# Patient Record
Sex: Female | Born: 1995 | Race: White | Hispanic: No | Marital: Married | Smoking: Current every day smoker
Health system: Southern US, Community
[De-identification: ages and names within clinical notes are randomized; demographics above are authoritative.]

## PROBLEM LIST (undated history)

## (undated) DIAGNOSIS — Z789 Other specified health status: Secondary | ICD-10-CM

## (undated) DIAGNOSIS — Z3490 Encounter for supervision of normal pregnancy, unspecified, unspecified trimester: Secondary | ICD-10-CM

## (undated) HISTORY — DX: Other specified health status: Z78.9

---
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
## 2013-01-02 ENCOUNTER — Ambulatory Visit: Payer: BC Managed Care – PPO | Admitting: Women's Health

## 2013-11-12 ENCOUNTER — Emergency Department (HOSPITAL_COMMUNITY)
Admission: EM | Admit: 2013-11-12 | Discharge: 2013-11-12 | Disposition: A | Discharge status: Home / Self Care | Payer: BC Managed Care – PPO | Attending: Emergency Medicine | Admitting: Emergency Medicine

## 2013-11-12 ENCOUNTER — Encounter (HOSPITAL_COMMUNITY): Payer: Self-pay | Admitting: Emergency Medicine

## 2013-11-12 ENCOUNTER — Emergency Department (HOSPITAL_COMMUNITY): Payer: BC Managed Care – PPO

## 2013-11-12 DIAGNOSIS — Y929 Unspecified place or not applicable: Secondary | ICD-10-CM | POA: Diagnosis not present

## 2013-11-12 DIAGNOSIS — S92309A Fracture of unspecified metatarsal bone(s), unspecified foot, initial encounter for closed fracture: Secondary | ICD-10-CM | POA: Diagnosis not present

## 2013-11-12 DIAGNOSIS — Z79899 Other long term (current) drug therapy: Secondary | ICD-10-CM | POA: Diagnosis not present

## 2013-11-12 DIAGNOSIS — Z792 Long term (current) use of antibiotics: Secondary | ICD-10-CM | POA: Diagnosis not present

## 2013-11-12 DIAGNOSIS — Z87891 Personal history of nicotine dependence: Secondary | ICD-10-CM | POA: Insufficient documentation

## 2013-11-12 DIAGNOSIS — Y9389 Activity, other specified: Secondary | ICD-10-CM | POA: Insufficient documentation

## 2013-11-12 DIAGNOSIS — S92302A Fracture of unspecified metatarsal bone(s), left foot, initial encounter for closed fracture: Secondary | ICD-10-CM

## 2013-11-12 DIAGNOSIS — S99929A Unspecified injury of unspecified foot, initial encounter: Secondary | ICD-10-CM | POA: Diagnosis present

## 2013-11-12 DIAGNOSIS — X500XXA Overexertion from strenuous movement or load, initial encounter: Secondary | ICD-10-CM | POA: Diagnosis not present

## 2013-11-12 DIAGNOSIS — S8990XA Unspecified injury of unspecified lower leg, initial encounter: Secondary | ICD-10-CM | POA: Diagnosis present

## 2013-11-12 MED ORDER — HYDROCODONE-ACETAMINOPHEN 5-325 MG PO TABS
1.0000 | ORAL_TABLET | Freq: Once | ORAL | Status: AC
  Administered 2013-11-12: 1 via ORAL
  Filled 2013-11-12: qty 1

## 2013-11-12 MED ORDER — HYDROCODONE-ACETAMINOPHEN 5-325 MG PO TABS: ORAL_TABLET | ORAL | Status: DC

## 2013-11-12 NOTE — ED Notes (Signed)
Pt verbalized understanding of no driving and to use caution within 4 hours of taking pain med due to med causes drowsiness  

## 2013-11-12 NOTE — ED Notes (Signed)
Pt had crutches with her

## 2013-11-12 NOTE — Discharge Instructions (Signed)
Metatarsal Fracture, Undisplaced A metatarsal fracture is a break in the bone(s) of the foot. These are the bones of the foot that connect your toes to the bones of the ankle. DIAGNOSIS  The diagnoses of these fractures are usually made with X-rays. If there are problems in the forefoot and x-rays are normal a later bone scan will usually make the diagnosis.  TREATMENT AND HOME CARE INSTRUCTIONS  Treatment may or may not include a cast or walking shoe. When casts are needed the use is usually for short periods of time so as not to slow down healing with muscle wasting (atrophy).  Activities should be stopped until further advised by your caregiver.  Wear shoes with adequate shock absorbing capabilities and stiff soles.  Alternative exercise may be undertaken while waiting for healing. These may include bicycling and swimming, or as your caregiver suggests.  It is important to keep all follow-up visits or specialty referrals. The failure to keep these appointments could result in improper bone healing and chronic pain or disability.  Warning: Do not drive a car or operate a motor vehicle until your caregiver specifically tells you it is safe to do so. IF YOU DO NOT HAVE A CAST OR SPLINT:  You may walk on your injured foot as tolerated or advised.  Do not put any weight on your injured foot for as long as directed by your caregiver. Slowly increase the amount of time you walk on the foot as the pain allows or as advised.  Use crutches until you can bear weight without pain. A gradual increase in weight bearing may help.  Apply ice to the injury for 15-20 minutes each hour while awake for the first 2 days. Put the ice in a plastic bag and place a towel between the bag of ice and your skin.  Only take over-the-counter or prescription medicines for pain, discomfort, or fever as directed by your caregiver. SEEK IMMEDIATE MEDICAL CARE IF:   Your cast gets damaged or breaks.  You have  continued severe pain or more swelling than you did before the cast was put on, or the pain is not controlled with medications.  Your skin or nails below the injury turn blue or grey, or feel cold or numb.  There is a bad smell, or new stains or pus-like (purulent) drainage coming from the cast. MAKE SURE YOU:   Understand these instructions.  Will watch your condition.  Will get help right away if you are not doing well or get worse. Document Released: 02/14/2002 Document Revised: 08/17/2011 Document Reviewed: 01/06/2008 Exit[employer] Patient Information 2014 [employer].  

## 2013-11-12 NOTE — ED Provider Notes (Signed)
CSN: [id number]     Arrival date & time 11/12/13  1731 History  This chart was scribed for non-physician practitioner Pauline Ausammy Cassian Torelli, PA-C working with Vanetta MuldersScott Zackowski, MD by Danella Maiersaroline Early, ED Scribe. This patient was seen in room APFT23/APFT23 and the patient's care was started at 6:19 PM.    Chief Complaint  Patient presents with  . Foot Pain   Patient is a 18 y.o. female presenting with foot injury. The history is provided by the patient. No language interpreter was used.  Foot Injury Location:  Foot Foot location:  Dorsum of L foot Pain details:    Radiates to:  Does not radiate   Severity:  Moderate   Onset quality:  Sudden Associated symptoms: no back pain, no fatigue, no fever and no neck pain    HPI Comments: Theresa Brady is a 18 y.o. female who presents to the Emergency Department complaining of left foot pain with associated swelling since catching herself from falling while walking down the stairs in her home earlier today. She states she inverted the foot when she landed on it. She denies hitting her head or LOC. She denies neck pain, back pain, ankle pain, hip pain, or pain anywhere else. She iced the earlier PTA.    Past Medical History  Diagnosis Date  . Pregnant    Past Surgical History  Procedure Laterality Date  . Cholecystectomy N/A 2011   Family History  Problem Relation Age of Onset  . Hypertension Father    History  Substance Use Topics  . Smoking status: Former Smoker    Types: Cigarettes  . Smokeless tobacco: Not on file  . Alcohol Use: No   OB History   Grav Para Term Preterm Abortions TAB SAB Ect Mult Living   1 1 1       1      Review of Systems  Constitutional: Negative for fever, chills and fatigue.  HENT: Negative for sore throat and trouble swallowing.   Respiratory: Negative for cough, shortness of breath and wheezing.   Cardiovascular: Negative for chest pain and palpitations.  Gastrointestinal: Negative for nausea, vomiting,  abdominal pain and blood in stool.  Genitourinary: Negative for dysuria, hematuria and flank pain.  Musculoskeletal: Positive for arthralgias. Negative for back pain, myalgias, neck pain and neck stiffness.  Skin: Negative for rash.  Neurological: Negative for dizziness, weakness and numbness.  Hematological: Does not bruise/bleed easily.      Allergies  Review of patient's allergies indicates no known allergies.  Home Medications   Prior to Admission medications   Medication Sig Start Date End Date Taking? Authorizing Provider  amoxicillin (AMOXIL) 250 MG capsule Take 250 mg by mouth 2 (two) times daily.    Historical Provider, MD  ibuprofen (ADVIL,MOTRIN) 600 MG tablet Take 1 tablet (600 mg total) by mouth every 6 (six) hours. 10/15/12   Arabella MerlesKimberly D Shaw, CNM  levonorgestrel (MIRENA) 20 MCG/24HR IUD 1 each by Intrauterine route once.    Historical Provider, MD   BP 106/58  Pulse 64  Temp(Src) 98.3 F (36.8 C) (Oral)  Resp 20  Ht 5\' 2"  (1.575 m)  Wt 206 lb (93.441 kg)  BMI 37.67 kg/m2  SpO2 98% Physical Exam  Nursing note and vitals reviewed. Constitutional: She is oriented to person, place, and time. She appears well-developed and well-nourished. No distress.  HENT:  Head: Normocephalic and atraumatic.  Mouth/Throat: Oropharynx is clear and moist.  Neck: Normal range of motion. Neck supple.  Cardiovascular: Normal rate,  regular rhythm and normal heart sounds.   Pulmonary/Chest: Effort normal and breath sounds normal. No respiratory distress. She exhibits no tenderness.  Abdominal: Soft. She exhibits no distension. There is no tenderness.  Musculoskeletal: Normal range of motion. She exhibits no tenderness.  Localized TTP proximal left fifth metatarsal. Mild soft tissue swelling present. DP pulse is brisk. distal sensation intact. No tenderness proximally. No spinal tenderness.  Lymphadenopathy:    She has no cervical adenopathy.  Neurological: She is alert and oriented to  person, place, and time. She exhibits normal muscle tone. Coordination normal.  Skin: Skin is warm and dry.    ED Course  Procedures (including critical care time) Medications - No data to display  DIAGNOSTIC STUDIES: Oxygen Saturation is 98% on RA, normal by my interpretation.    COORDINATION OF CARE: 7:02 PM- Discussed treatment plan with pt which includes discharge home with cam walker and hydrocodone. Will refer to ortho. Advised ice, elevation, and rest. Pt agrees to plan.    Labs Review Labs Reviewed - No data to display  Imaging Review Dg Foot Complete Left  11/12/2013   CLINICAL DATA:  Foot injury walking down stairs.  Lateral foot pain.  EXAM: LEFT FOOT - COMPLETE 3+ VIEW  COMPARISON:  None.  FINDINGS: A nondisplaced fracture is seen involving the proximal fifth metatarsal shaft. No other fractures are identified. No evidence of dislocation.  IMPRESSION: Nondisplaced fracture of the proximal fifth metatarsal shaft.   Electronically Signed   By: Myles Rosenthal M.D.   On: 11/12/2013 18:45     EKG Interpretation None      MDM   Final diagnoses:  Fracture of metatarsal of left foot, closed    Pt is well appearing, pain improved after cam walker, remains NV intact.  Compartments of the left leg are soft.  vicodin #20 for pain     I personally performed the services described in this documentation, which was scribed in my presence. The recorded information has been reviewed and is accurate.   Garnet Overfield L. Trisha Mangle, PA-C 11/15/13 1251

## 2013-11-12 NOTE — ED Notes (Signed)
States she was coming down some steps and injured her left foot, denies ankle injury

## 2013-11-16 NOTE — ED Provider Notes (Signed)
Medical screening examination/treatment/procedure(s) were performed by non-physician practitioner and as supervising physician I was immediately available for consultation/collaboration.   EKG Interpretation None        Vanetta Mulders, MD 11/16/13 1108

## 2014-04-09 ENCOUNTER — Encounter (HOSPITAL_COMMUNITY): Payer: Self-pay | Admitting: Emergency Medicine

## 2015-05-15 IMAGING — CR DG FOOT COMPLETE 3+V*L*
3 series · 3 of 3 positions shown · non-contrast
Comparison: None.

CLINICAL DATA: Foot injury walking down stairs.  Lateral foot pain.

EXAM:
LEFT FOOT - COMPLETE 3+ VIEW

[view not recorded (1 of 3)]
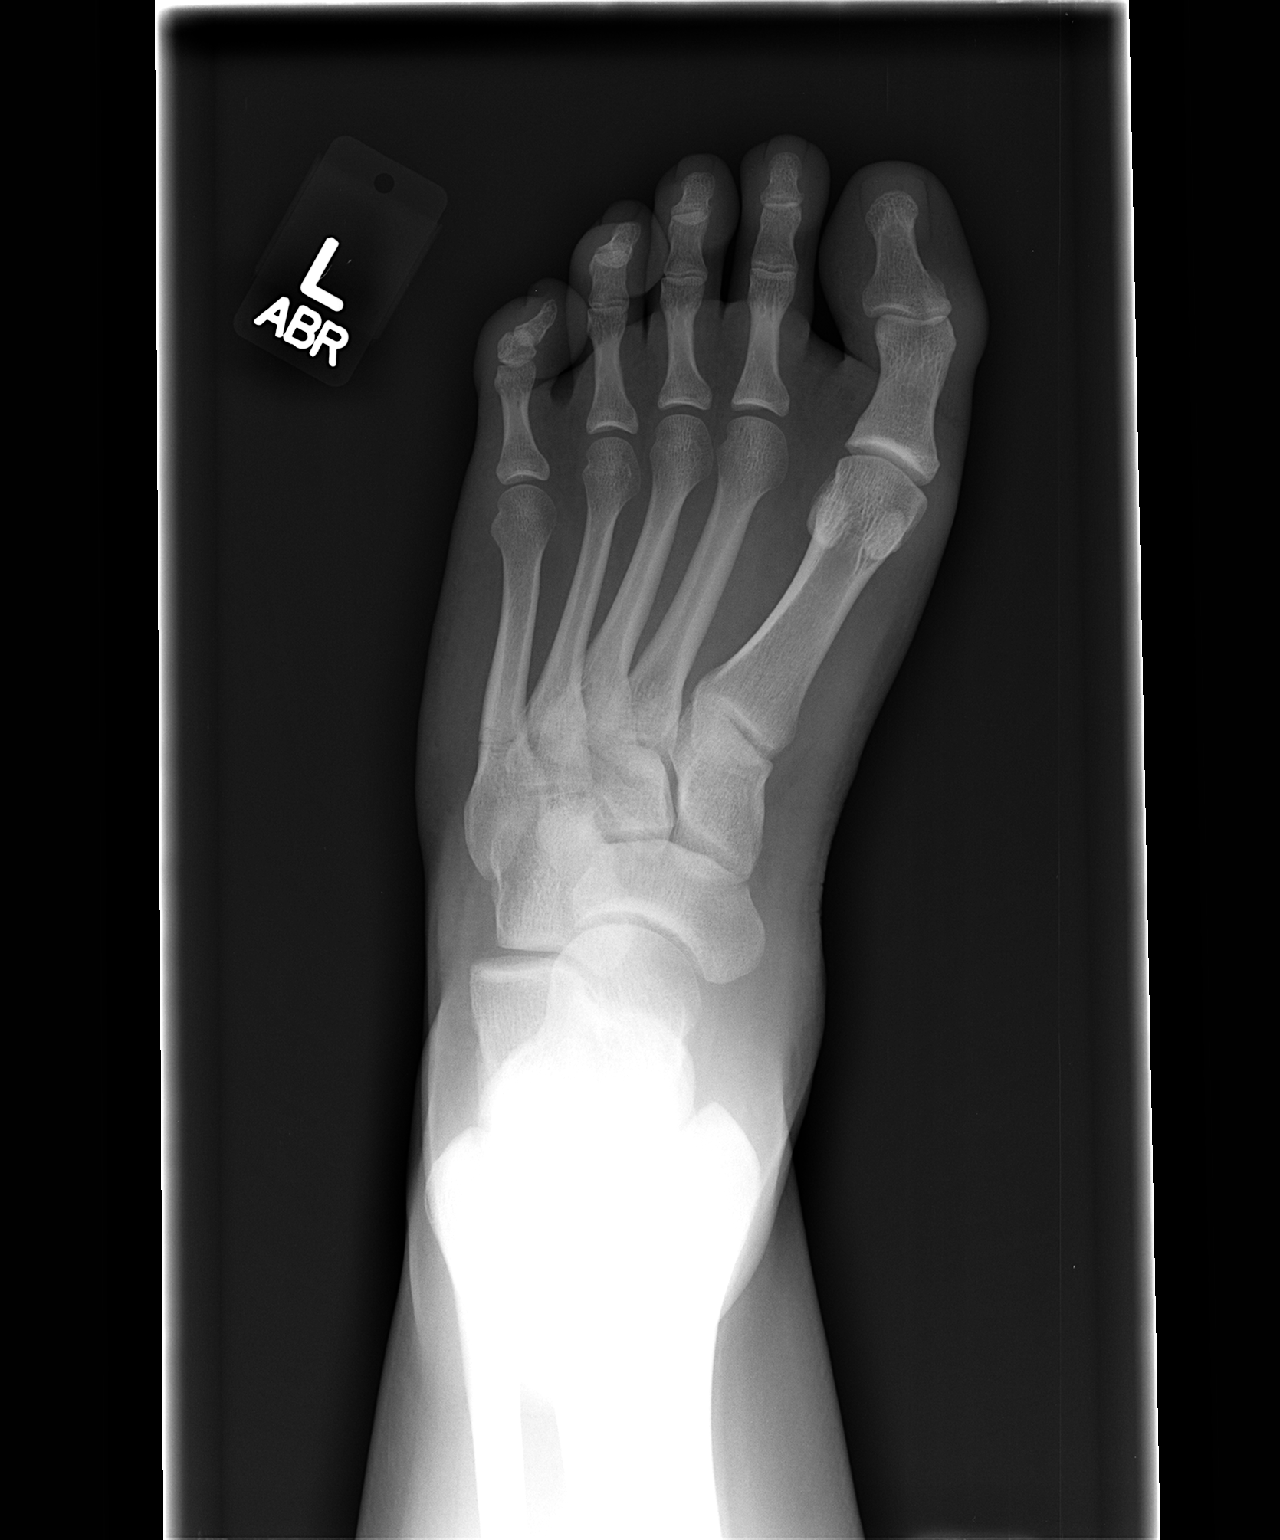

[view not recorded (2 of 3)]
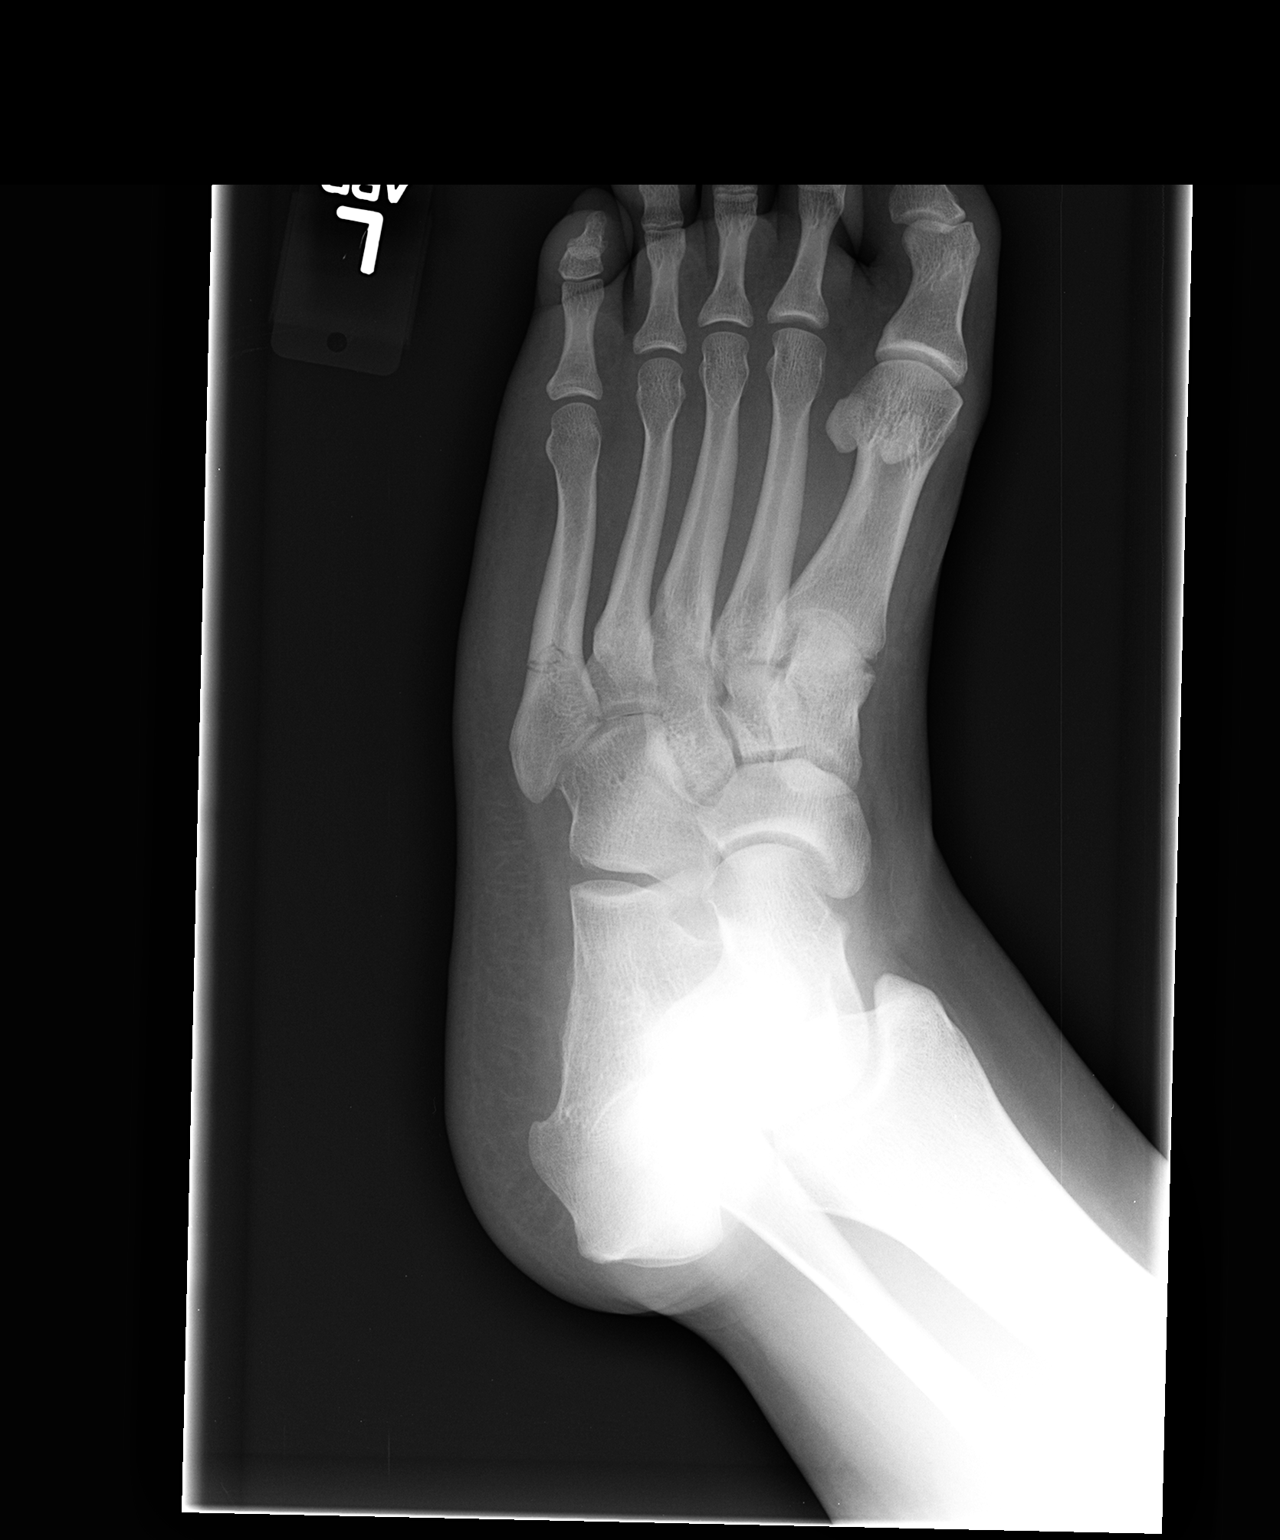

[view not recorded (3 of 3)]
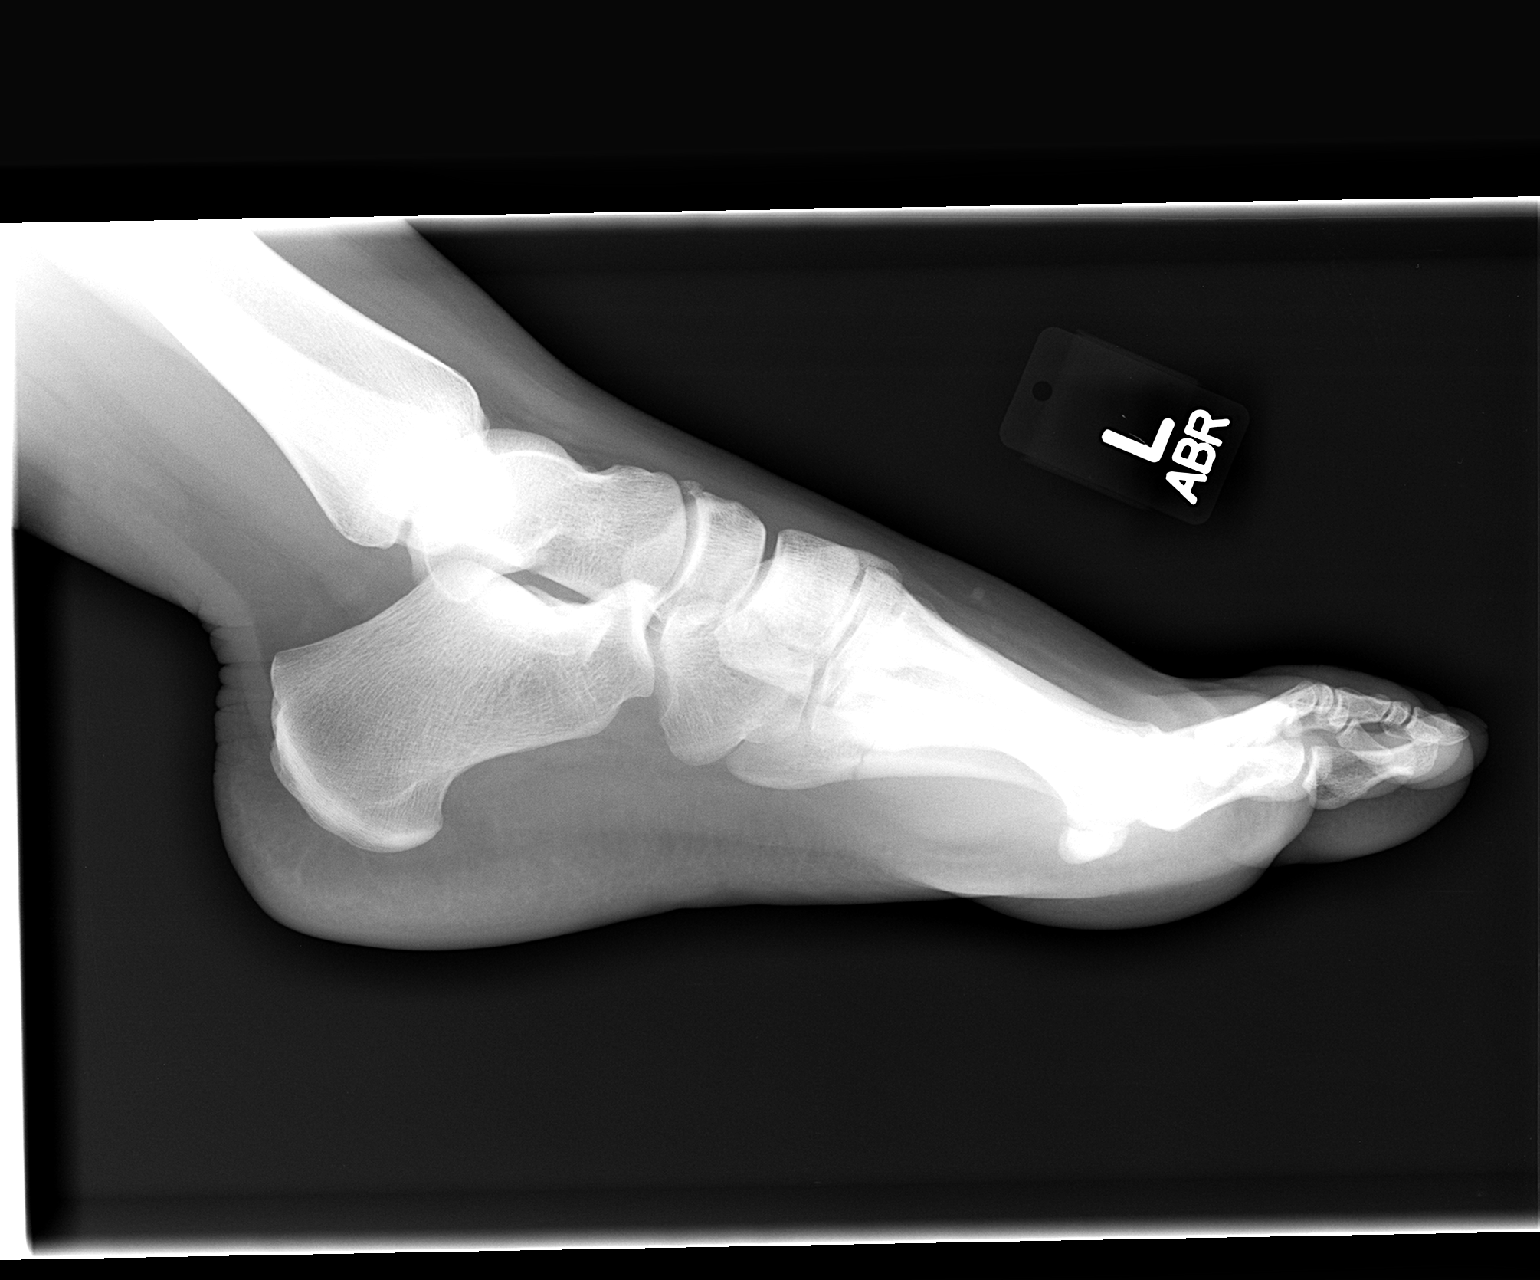

[3 of 3 positions shown; findings below may reference images not displayed]

FINDINGS: A nondisplaced fracture is seen involving the proximal fifth
metatarsal shaft. No other fractures are identified. No evidence of
dislocation.
IMPRESSION: Nondisplaced fracture of the proximal fifth metatarsal shaft.

## 2020-04-16 ENCOUNTER — Other Ambulatory Visit (HOSPITAL_COMMUNITY)
Admission: RE | Admit: 2020-04-16 | Discharge: 2020-04-16 | Disposition: A | Discharge status: Home / Self Care | Payer: Medicaid Other | Source: Ambulatory Visit | Attending: Nurse Practitioner | Admitting: Nurse Practitioner

## 2020-04-16 DIAGNOSIS — R87610 Atypical squamous cells of undetermined significance on cytologic smear of cervix (ASC-US): Secondary | ICD-10-CM | POA: Diagnosis present

## 2020-04-16 DIAGNOSIS — N871 Moderate cervical dysplasia: Secondary | ICD-10-CM | POA: Insufficient documentation

## 2020-04-23 LAB — SURGICAL PATHOLOGY

## 2020-07-17 ENCOUNTER — Telehealth: Payer: Self-pay | Admitting: Obstetrics & Gynecology

## 2020-07-17 NOTE — Telephone Encounter (Signed)
I tried contacting patient to go ahead and scheduled her for a pre op with Dr. Despina Hidden but allt he numbers on her account has been either wrong numbers or disconnected. I even contacted her emergency contact and this was a wrong number. I have no other way to contact the patient to inform her to schedule an appointment.

## 2023-03-31 ENCOUNTER — Encounter: Payer: Self-pay | Admitting: Adult Health

## 2023-03-31 ENCOUNTER — Ambulatory Visit: Payer: Managed Care, Other (non HMO) | Admitting: Adult Health

## 2023-03-31 VITALS — BP 107/72 | HR 66 | Ht 62.0 in | Wt 245.5 lb

## 2023-03-31 DIAGNOSIS — O219 Vomiting of pregnancy, unspecified: Secondary | ICD-10-CM | POA: Insufficient documentation

## 2023-03-31 DIAGNOSIS — Z3201 Encounter for pregnancy test, result positive: Secondary | ICD-10-CM | POA: Insufficient documentation

## 2023-03-31 DIAGNOSIS — Z3A00 Weeks of gestation of pregnancy not specified: Secondary | ICD-10-CM | POA: Diagnosis not present

## 2023-03-31 DIAGNOSIS — Z3490 Encounter for supervision of normal pregnancy, unspecified, unspecified trimester: Secondary | ICD-10-CM | POA: Insufficient documentation

## 2023-03-31 DIAGNOSIS — Z1331 Encounter for screening for depression: Secondary | ICD-10-CM | POA: Diagnosis not present

## 2023-03-31 DIAGNOSIS — O3680X0 Pregnancy with inconclusive fetal viability, not applicable or unspecified: Secondary | ICD-10-CM | POA: Insufficient documentation

## 2023-03-31 LAB — POCT URINE PREGNANCY: Preg Test, Ur: POSITIVE — AB

## 2023-03-31 MED ORDER — ONDANSETRON HCL 4 MG PO TABS: 4.0000 mg | ORAL_TABLET | Freq: Three times a day (TID) | ORAL | 1 refills | Status: DC | PRN

## 2023-03-31 NOTE — Progress Notes (Signed)
Subjective:     Patient ID: Theresa Brady, female   DOB: 17-Aug-1995, 27 y.o.   MRN: [id number]  HPI Theresa Brady is a 27 year old white female, married, G2P1001 in for confirmation of pregnancy. Does not remember when last period was, got married a month ago and has had nausea and vomiting for about 2 1/2 weeks. Has had 1 HPT that was positive. Her son is 10.    Review of Systems Does not remember last period Has not felt movement  +Nausea and vomiting  Reviewed past medical,surgical, social and family history. Reviewed medications and allergies.     Objective:   Physical Exam BP 107/72 (BP Location: Left Arm, Patient Position: Sitting, Cuff Size: Normal)   Pulse 66   Ht 5\' 2"  (1.575 m)   Wt 245 lb 8 oz (111.4 kg)   LMP  (LMP Unknown) Comment: no period in 4-5 months  BMI 44.90 kg/m  UPT +   Skin warm and dry. Neck: mid line trachea, normal thyroid, good ROM, no lymphadenopathy noted. Lungs: clear to ausculation bilaterally. Cardiovascular: regular rate and rhythm.  AA is 0 Fall risk is low    03/31/2023   10:17 AM  Depression screen PHQ 2/9  Decreased Interest 0  Down, Depressed, Hopeless 0  PHQ - 2 Score 0  Altered sleeping 1  Tired, decreased energy 0  Change in appetite 0  Feeling bad or failure about yourself  0  Trouble concentrating 0  Moving slowly or fidgety/restless 0  Suicidal thoughts 0  PHQ-9 Score 1       03/31/2023   10:17 AM  GAD 7 : Generalized Anxiety Score  Nervous, Anxious, on Edge 1  Control/stop worrying 0  Worry too much - different things 0  Trouble relaxing 0  Restless 0  Easily annoyed or irritable 0  Afraid - awful might happen 0  Total GAD 7 Score 1      Upstream - 03/31/23 1025       Pregnancy Intention Screening   Does the patient want to become pregnant in the next year? N/A    Does the patient's partner want to become pregnant in the next year? N/A    Would the patient like to discuss contraceptive options today? N/A       Contraception Wrap Up   Current Method Pregnant/Seeking Pregnancy    End Method Pregnant/Seeking Pregnancy    Contraception Counseling Provided No             Assessment:     1. Pregnancy examination or test, positive result UPT + UNK LMP with check QHCG  - POCT urine pregnancy - Beta hCG quant (ref lab)  2. Pregnancy, unspecified gestational age Will check QHCG and get [place] scheduled Take OTC PNV  3. Encounter to determine fetal viability of pregnancy, single or unspecified fetus Return in 1 week for dating [place] - US OB Comp Less 14 Wks; Future  4. Nausea and vomiting during pregnancy prior to [redacted] weeks gestation +nausea and vomiting  Will rx Zofran Eat small frequent meals  Meds ordered this encounter  Medications   ondansetron (ZOFRAN) 4 MG tablet    Sig: Take 1 tablet (4 mg total) by mouth every 8 (eight) hours as needed.    Dispense:  20 tablet    Refill:  1    Order Specific Question:   Supervising Provider    Answer:   Lazaro Arms [[id number]]       Plan:  Review OB packet

## 2023-04-01 ENCOUNTER — Telehealth: Payer: Self-pay | Admitting: *Deleted

## 2023-04-01 LAB — BETA HCG QUANT (REF LAB): hCG Quant: 46133 m[IU]/mL

## 2023-04-01 NOTE — Telephone Encounter (Signed)
-----   Message from Cyril Mourning sent at 04/01/2023  8:27 AM EDT ----- Let her know about [facility]

## 2023-04-01 NOTE — Telephone Encounter (Signed)
Pt aware of quant results. She has an [place] appt scheduled and was advised to keep that appt. Call sooner if problems. Pt voiced understanding. JSY

## 2023-04-06 ENCOUNTER — Other Ambulatory Visit: Payer: Self-pay | Admitting: Adult Health

## 2023-04-06 DIAGNOSIS — O219 Vomiting of pregnancy, unspecified: Secondary | ICD-10-CM

## 2023-04-06 DIAGNOSIS — O3680X0 Pregnancy with inconclusive fetal viability, not applicable or unspecified: Secondary | ICD-10-CM

## 2023-04-06 DIAGNOSIS — Z3201 Encounter for pregnancy test, result positive: Secondary | ICD-10-CM

## 2023-04-06 DIAGNOSIS — Z3687 Encounter for antenatal screening for uncertain dates: Secondary | ICD-10-CM

## 2023-04-06 DIAGNOSIS — Z3490 Encounter for supervision of normal pregnancy, unspecified, unspecified trimester: Secondary | ICD-10-CM

## 2023-04-07 ENCOUNTER — Ambulatory Visit: Payer: Managed Care, Other (non HMO) | Admitting: Radiology

## 2023-04-07 ENCOUNTER — Encounter: Payer: Self-pay | Admitting: Obstetrics & Gynecology

## 2023-04-07 DIAGNOSIS — O99211 Obesity complicating pregnancy, first trimester: Secondary | ICD-10-CM

## 2023-04-07 DIAGNOSIS — O3680X0 Pregnancy with inconclusive fetal viability, not applicable or unspecified: Secondary | ICD-10-CM

## 2023-04-07 DIAGNOSIS — Z3A11 11 weeks gestation of pregnancy: Secondary | ICD-10-CM

## 2023-04-07 DIAGNOSIS — Z3687 Encounter for antenatal screening for uncertain dates: Secondary | ICD-10-CM

## 2023-04-07 NOTE — Progress Notes (Signed)
Unknown LMP,  dating/viability Single viable active fetus,  cephalic    FHR = 165bpm CRL = 4.3cm = [redacted]w[redacted]d    EDD by today's u/s = 10-26-23 Posterior pl,  central ci,  normal amn fluid vol Normal Cervix appears closed  -  normal ov's  -  neg Adnexal regions,  Neg CDS - no free fluid

## 2023-04-16 ENCOUNTER — Other Ambulatory Visit: Payer: Self-pay | Admitting: Obstetrics & Gynecology

## 2023-04-16 ENCOUNTER — Encounter: Payer: Self-pay | Admitting: Advanced Practice Midwife

## 2023-04-16 DIAGNOSIS — Z3490 Encounter for supervision of normal pregnancy, unspecified, unspecified trimester: Secondary | ICD-10-CM | POA: Insufficient documentation

## 2023-04-16 DIAGNOSIS — Z3682 Encounter for antenatal screening for nuchal translucency: Secondary | ICD-10-CM

## 2023-04-16 DIAGNOSIS — O0990 Supervision of high risk pregnancy, unspecified, unspecified trimester: Secondary | ICD-10-CM | POA: Insufficient documentation

## 2023-04-19 ENCOUNTER — Encounter: Payer: Self-pay | Admitting: Advanced Practice Midwife

## 2023-04-19 ENCOUNTER — Ambulatory Visit (INDEPENDENT_AMBULATORY_CARE_PROVIDER_SITE_OTHER): Payer: Managed Care, Other (non HMO)

## 2023-04-19 ENCOUNTER — Other Ambulatory Visit (HOSPITAL_COMMUNITY)
Admission: RE | Admit: 2023-04-19 | Discharge: 2023-04-19 | Disposition: A | Discharge status: Home / Self Care | Payer: Managed Care, Other (non HMO) | Source: Ambulatory Visit | Attending: Advanced Practice Midwife | Admitting: Advanced Practice Midwife

## 2023-04-19 ENCOUNTER — Encounter: Payer: Managed Care, Other (non HMO) | Admitting: *Deleted

## 2023-04-19 ENCOUNTER — Ambulatory Visit (INDEPENDENT_AMBULATORY_CARE_PROVIDER_SITE_OTHER): Payer: Managed Care, Other (non HMO) | Admitting: Advanced Practice Midwife

## 2023-04-19 VITALS — BP 104/66 | HR 70 | Wt 247.0 lb

## 2023-04-19 DIAGNOSIS — Z124 Encounter for screening for malignant neoplasm of cervix: Secondary | ICD-10-CM | POA: Diagnosis present

## 2023-04-19 DIAGNOSIS — Z3481 Encounter for supervision of other normal pregnancy, first trimester: Secondary | ICD-10-CM | POA: Diagnosis not present

## 2023-04-19 DIAGNOSIS — Z3682 Encounter for antenatal screening for nuchal translucency: Secondary | ICD-10-CM

## 2023-04-19 DIAGNOSIS — Z3A12 12 weeks gestation of pregnancy: Secondary | ICD-10-CM

## 2023-04-19 DIAGNOSIS — Z113 Encounter for screening for infections with a predominantly sexual mode of transmission: Secondary | ICD-10-CM

## 2023-04-19 DIAGNOSIS — Z3480 Encounter for supervision of other normal pregnancy, unspecified trimester: Secondary | ICD-10-CM | POA: Diagnosis present

## 2023-04-19 MED ORDER — ASPIRIN 81 MG PO TBEC: 162.0000 mg | DELAYED_RELEASE_TABLET | Freq: Every day | ORAL | 6 refills | Status: DC

## 2023-04-19 NOTE — Progress Notes (Addendum)
[place] 12+6 wks,measurement c/w dates,FHR 153 bpm,normal ovaries,NB present,NT 1.6 mm,CRL 66.36 mm,posterior placenta,please have pt come back today for additional images

## 2023-04-19 NOTE — Patient Instructions (Addendum)
Safe Medications in Pregnancy   Acne: Benzoyl Peroxide Salicylic Acid  Backache/Headache: Tylenol: 2 regular strength every 4 hours OR              2 Extra strength every 6 hours  Colds/Coughs/Allergies: Benadryl (alcohol free) 25 mg every 6 hours as needed Breath right strips Claritin Cepacol throat lozenges Chloraseptic throat spray Cold-Eeze- up to three times per day Cough drops, alcohol free Flonase (by prescription only) Guaifenesin Mucinex Robitussin DM (plain only, alcohol free) Saline nasal spray/drops Sudafed (pseudoephedrine) & Actifed ** use only after [redacted] weeks gestation and if you do not have high blood pressure Tylenol Vicks Vaporub Zinc lozenges Zyrtec   Constipation: Colace Ducolax suppositories Fleet enema Glycerin suppositories Metamucil Milk of magnesia Miralax Senokot Smooth move tea  Diarrhea: Kaopectate Imodium A-D  *NO pepto Bismol  Hemorrhoids: Anusol Anusol HC Preparation H Tucks  Indigestion: Tums Maalox Mylanta Zantac  Pepcid  Insomnia: Benadryl (alcohol free) 25mg  every 6 hours as needed Tylenol PM Unisom, no Gelcaps  Leg Cramps: Tums MagGel  Nausea/Vomiting:  Bonine Dramamine Emetrol Ginger extract Sea bands Meclizine  Nausea medication to take during pregnancy:  Unisom (doxylamine succinate 25 mg tablets) Take one tablet daily at bedtime. If symptoms are not adequately controlled, the dose can be increased to a maximum recommended dose of two tablets daily (1/2 tablet in the morning, 1/2 tablet mid-afternoon and one at bedtime). Vitamin B6 100mg  tablets. Take one tablet twice a day (up to 200 mg per day).  Skin Rashes: Aveeno products Benadryl cream or 25mg  every 6 hours as needed Calamine Lotion 1% cortisone cream  Yeast infection: Gyne-lotrimin 7 Monistat 7   **If taking multiple medications, please check labels to avoid duplicating the same active ingredients **take medication as directed on  the label ** Do not exceed 4000 mg of tylenol in 24 hours **Do not take medications that contain aspirin or ibuprofen   [facility] offers online women's holistic mental health counseling and therapy provided by licensed mental health counselors and therapists.   You can refer yourself using the link below: (if it isn't clickable from your mychart account, copy and paste it in a new browser).  If you have ANY problems, please let me know and I will help troubleshoot.   [email or web address]  OR  [email or web address]

## 2023-04-19 NOTE — Progress Notes (Unsigned)
INITIAL OBSTETRICAL VISIT Patient name: Theresa Brady MRN [id number]  Date of birth: 07/19/1995 Chief Complaint:   Initial Prenatal Visit (/)  History of Present Illness:   Theresa Brady is a 27 y.o. G65P1001  female at [redacted]w[redacted]d by [place] at 51 weeks with an Estimated Date of Delivery: 10/26/23 being seen today for her initial obstetrical visit.   Her obstetrical history is significant for term SVD 10 years ago. .   Today she reports no complaints.     04/19/2023    1:41 PM 03/31/2023   10:17 AM  Depression screen PHQ 2/9  Decreased Interest 0 0  Down, Depressed, Hopeless 0 0  PHQ - 2 Score 0 0  Altered sleeping 1 1  Tired, decreased energy 1 0  Change in appetite 0 0  Feeling bad or failure about yourself  0 0  Trouble concentrating 0 0  Moving slowly or fidgety/restless 0 0  Suicidal thoughts 0 0  PHQ-9 Score 2 1    No LMP recorded (lmp unknown). Patient is pregnant. Last pap 02/2020, normal Review of Systems:   Pertinent items are noted in HPI Denies cramping/contractions, leakage of fluid, vaginal bleeding, abnormal vaginal discharge w/ itching/odor/irritation, headaches, visual changes, shortness of breath, chest pain, abdominal pain, severe nausea/vomiting, or problems with urination or bowel movements unless otherwise stated above.  Pertinent History Reviewed:  Reviewed past medical,surgical, social, obstetrical and family history.  Reviewed problem list, medications and allergies. OB History  Gravida Para Term Preterm AB Living  2 1 1     1   SAB IAB Ectopic Multiple Live Births          1    # Outcome Date GA Lbr Len/2nd Weight Sex Type Anes PTL Lv  2 Current           1 Term 10/14/12 [redacted]w[redacted]d 10:16 / 00:17 7 lb 1.6 oz (3.22 kg) M Vag-Spont EPI  LIV   Physical Assessment:   Vitals:   04/19/23 1421  BP: 104/66  Pulse: 70  There is no height or weight on file to calculate BMI.       Physical Examination:  General appearance - well appearing, and in no  distress  Mental status - alert, oriented to person, place, and time  Psych:  She has a normal mood and affect  Skin - warm and dry, normal color, no suspicious lesions noted  Chest - effort normal  Heart - normal rate and regular rhythm  Abdomen - soft, nontender  Extremities:  No swelling or varicosities noted  Pelvic - VULVA: normal appearing vulva with no masses, tenderness or lesions  VAGINA: normal appearing vagina with normal color and discharge, no lesions  CERVIX: normal appearing cervix without discharge or lesions, no CMT  Thin prep pap is done with HR HPV cotesting  TODAY'S NT [place] 12+6 wks,measurement c/w dates,FHR 153 bpm,normal ovaries,NB present,NT 1.6 mm,CRL 66.36 mm,posterior placenta,please have pt come back today for additional images   No results found for this or any previous visit (from the past 24 hour(s)).   Indications for ASA therapy (per uptodate) One of the following: Previous pregnancy with preeclampsia, especially early onset and with an adverse outcome No Multifetal gestation No Chronic hypertension No Type 1 or 2 diabetes mellitus No Chronic kidney disease No Autoimmune disease (antiphospholipid syndrome, systemic lupus erythematosus) No  Two or more of the following: Nulliparity No Obesity (body mass index >30 kg/m2) Yes Family history of preeclampsia in mother or  sister No Age >=35 years No Sociodemographic characteristics (African American race, low socioeconomic level) No Personal risk factors (eg, previous pregnancy with low birth weight or small for gestational age infant, previous adverse pregnancy outcome [eg, stillbirth], interval >10 years between pregnancies) Yes  I    04/19/2023    1:41 PM 03/31/2023   10:17 AM  Depression screen PHQ 2/9  Decreased Interest 0 0  Down, Depressed, Hopeless 0 0  PHQ - 2 Score 0 0  Altered sleeping 1 1  Tired, decreased energy 1 0  Change in appetite 0 0  Feeling bad or failure about yourself  0 0   Trouble concentrating 0 0  Moving slowly or fidgety/restless 0 0  Suicidal thoughts 0 0  PHQ-9 Score 2 1        04/19/2023    1:41 PM 03/31/2023   10:17 AM  GAD 7 : Generalized Anxiety Score  Nervous, Anxious, on Edge 1 1  Control/stop worrying 1 0  Worry too much - different things 1 0  Trouble relaxing 0 0  Restless 0 0  Easily annoyed or irritable 1 0  Afraid - awful might happen 1 0  Total GAD 7 Score 5 1      Assessment & Plan:  1) Low-Risk Pregnancy G2P1001 at [redacted]w[redacted]d with an Estimated Date of Delivery: 10/26/23   2) Initial OB visit    1. Supervision of other normal pregnancy, antepartum   2. [redacted] weeks gestation of pregnancy   3. Screening for STD (sexually transmitted disease)   4. Screening for cervical cancer  Start asa   Meds: No orders of the defined types were placed in this encounter.   Initial labs obtained Continue prenatal vitamins Reviewed n/v relief measures and warning s/s to report Reviewed recommended weight gain based on pre-gravid BMI Encouraged well-balanced diet Genetic & carrier screening discussed: requests Panorama, NT/IT, and Horizon , declines AFP Ultrasound discussed; fetal survey: requested CCNC completed> form faxed if has or is planning to apply for medicaid The nature of [facility] [employer] with multiple MDs and other Advanced Practice Providers was explained to patient; also emphasized that fellows, residents, and students are part of our team. Given a  home bp cuff.. Check bp weekly, let us know if >140/90.        Theresa Brady 2:30 PM

## 2023-04-20 ENCOUNTER — Other Ambulatory Visit: Payer: Managed Care, Other (non HMO)

## 2023-04-20 ENCOUNTER — Encounter: Payer: Managed Care, Other (non HMO) | Admitting: Women's Health

## 2023-04-20 ENCOUNTER — Encounter: Payer: Managed Care, Other (non HMO) | Admitting: *Deleted

## 2023-04-21 LAB — CBC/D/PLT+RPR+RH+ABO+RUBIGG...
Antibody Screen: NEGATIVE
Basophils Absolute: 0 10*3/uL (ref 0.0–0.2)
Basos: 0 %
EOS (ABSOLUTE): 0.2 10*3/uL (ref 0.0–0.4)
Eos: 1 %
HCV Ab: NONREACTIVE
HIV Screen 4th Generation wRfx: NONREACTIVE
Hematocrit: 41.5 % (ref 34.0–46.6)
Hemoglobin: 13.8 g/dL (ref 11.1–15.9)
Hepatitis B Surface Ag: NEGATIVE
Immature Grans (Abs): 0 10*3/uL (ref 0.0–0.1)
Immature Granulocytes: 0 %
Lymphocytes Absolute: 2.4 10*3/uL (ref 0.7–3.1)
Lymphs: 18 %
MCH: 31.2 pg (ref 26.6–33.0)
MCHC: 33.3 g/dL (ref 31.5–35.7)
MCV: 94 fL (ref 79–97)
Monocytes Absolute: 0.7 10*3/uL (ref 0.1–0.9)
Monocytes: 5 %
Neutrophils Absolute: 10.5 10*3/uL — ABNORMAL HIGH (ref 1.4–7.0)
Neutrophils: 76 %
Platelets: 256 10*3/uL (ref 150–450)
RBC: 4.43 x10E6/uL (ref 3.77–5.28)
RDW: 12.4 % (ref 11.7–15.4)
RPR Ser Ql: NONREACTIVE
Rh Factor: POSITIVE
Rubella Antibodies, IGG: 1.32 {index} (ref >0.99)
WBC: 13.8 10*3/uL — ABNORMAL HIGH (ref 3.4–10.8)

## 2023-04-21 LAB — INTEGRATED 1
Crown Rump Length: 66.4 mm
Gest. Age on Collection Date: 12.7 wk
Nuchal Translucency (NT): 1.6 mm
Number of Fetuses: 1
PAPP-A Value: 163.8 ng/mL
Sonographer ID#: 29.7 a
Sonographer ID#: 309760
Weight: 247 [lb_av]

## 2023-04-21 LAB — HCV INTERPRETATION

## 2023-04-21 LAB — URINE CULTURE

## 2023-04-23 LAB — CYTOLOGY - PAP
Chlamydia: NEGATIVE
Comment: NEGATIVE
Comment: NEGATIVE
Comment: NORMAL
Diagnosis: NEGATIVE
High risk HPV: NEGATIVE
Neisseria Gonorrhea: NEGATIVE

## 2023-04-26 LAB — PANORAMA PRENATAL TEST FULL PANEL:PANORAMA TEST PLUS 5 ADDITIONAL MICRODELETIONS: FETAL FRACTION: 4.2

## 2023-05-17 ENCOUNTER — Ambulatory Visit (INDEPENDENT_AMBULATORY_CARE_PROVIDER_SITE_OTHER): Payer: Managed Care, Other (non HMO) | Admitting: Women's Health

## 2023-05-17 ENCOUNTER — Encounter: Payer: Self-pay | Admitting: Women's Health

## 2023-05-17 VITALS — BP 102/72 | HR 73 | Wt 253.3 lb

## 2023-05-17 DIAGNOSIS — O99212 Obesity complicating pregnancy, second trimester: Secondary | ICD-10-CM

## 2023-05-17 DIAGNOSIS — Z3A16 16 weeks gestation of pregnancy: Secondary | ICD-10-CM

## 2023-05-17 DIAGNOSIS — O0990 Supervision of high risk pregnancy, unspecified, unspecified trimester: Secondary | ICD-10-CM

## 2023-05-17 DIAGNOSIS — E669 Obesity, unspecified: Secondary | ICD-10-CM

## 2023-05-17 DIAGNOSIS — Z363 Encounter for antenatal screening for malformations: Secondary | ICD-10-CM

## 2023-05-17 DIAGNOSIS — Z1379 Encounter for other screening for genetic and chromosomal anomalies: Secondary | ICD-10-CM

## 2023-05-17 DIAGNOSIS — Z6841 Body Mass Index (BMI) 40.0 and over, adult: Secondary | ICD-10-CM | POA: Insufficient documentation

## 2023-05-17 DIAGNOSIS — O0992 Supervision of high risk pregnancy, unspecified, second trimester: Secondary | ICD-10-CM

## 2023-05-17 NOTE — Progress Notes (Signed)
HIGH-RISK PREGNANCY VISIT Patient name: Theresa Brady MRN [id number]  Date of birth: 11-02-1995 Chief Complaint:   Routine Prenatal Visit (2nd IT)  History of Present Illness:   Theresa Brady is a 27 y.o. G62P1001 female at [redacted]w[redacted]d with an Estimated Date of Delivery: 10/26/23 being seen today for ongoing management of a high-risk pregnancy complicated by PG BMI >=40.    Today she reports  trouble sleeping . Contractions: Not present.  .  Movement: Present. denies leaking of fluid.      04/19/2023    1:41 PM 03/31/2023   10:17 AM  Depression screen PHQ 2/9  Decreased Interest 0 0  Down, Depressed, Hopeless 0 0  PHQ - 2 Score 0 0  Altered sleeping 1 1  Tired, decreased energy 1 0  Change in appetite 0 0  Feeling bad or failure about yourself  0 0  Trouble concentrating 0 0  Moving slowly or fidgety/restless 0 0  Suicidal thoughts 0 0  PHQ-9 Score 2 1        04/19/2023    1:41 PM 03/31/2023   10:17 AM  GAD 7 : Generalized Anxiety Score  Nervous, Anxious, on Edge 1 1  Control/stop worrying 1 0  Worry too much - different things 1 0  Trouble relaxing 0 0  Restless 0 0  Easily annoyed or irritable 1 0  Afraid - awful might happen 1 0  Total GAD 7 Score 5 1     Review of Systems:   Pertinent items are noted in HPI Denies abnormal vaginal discharge w/ itching/odor/irritation, headaches, visual changes, shortness of breath, chest pain, abdominal pain, severe nausea/vomiting, or problems with urination or bowel movements unless otherwise stated above. Pertinent History Reviewed:  Reviewed past medical,surgical, social, obstetrical and family history.  Reviewed problem list, medications and allergies. Physical Assessment:   Vitals:   05/17/23 1008  BP: 102/72  Pulse: 73  Weight: 253 lb 4.8 oz (114.9 kg)  Body mass index is 46.33 kg/m.           Physical Examination:   General appearance: alert, well appearing, and in no distress  Mental status: alert, oriented to  person, place, and time  Skin: warm & dry   Extremities: Edema: None    Cardiovascular: normal heart rate noted  Respiratory: normal respiratory effort, no distress  Abdomen: gravid, soft, non-tender  Pelvic: Cervical exam deferred         Fetal Status: Fetal Heart Rate (bpm): 143   Movement: Present    Fetal Surveillance Testing today: doppler   Chaperone: N/A    No results found for this or any previous visit (from the past 24 hour(s)).  Assessment & Plan:  High-risk pregnancy: G2P1001 at [redacted]w[redacted]d with an Estimated Date of Delivery: 10/26/23   1) PG BMI >=40, currently 46  2) Trouble sleeping, discussed and gave printed tips  Meds: No orders of the defined types were placed in this encounter.   Labs/procedures today: 2nd IT  Treatment Plan:  U/S 24, 28, 32, 36wks   2x/wk nst or weekly BPP @ 34wks   Deliver @ 39-40wks   Reviewed: Preterm labor symptoms and general obstetric precautions including but not limited to vaginal bleeding, contractions, leaking of fluid and fetal movement were reviewed in detail with the patient.  All questions were answered. Does have home bp cuff. Office bp cuff given: not applicable. Check bp weekly, let us know if consistently >140 and/or >90.  Follow-up: Return in  about 4 weeks (around 06/14/2023) for HROB, [id number], CNM, in person.   Future Appointments  Date Time Provider Department Center  06/15/2023 10:00 AM [facility] [place] [facility] None  06/15/2023 10:50 AM Myna Hidalgo, DO [facility]    Orders Placed This Encounter  Procedures   US OB Comp + 14 Wk   INTEGRATED 2   Cheral Marker [place], [facility] 05/17/2023 10:42 AM

## 2023-05-17 NOTE — Patient Instructions (Addendum)
Theresa Brady, thank you for choosing our office today! We appreciate the opportunity to meet your healthcare needs. You may receive a short survey by mail, e-mail, or through [employer]. If you are happy with your care we would appreciate if you could take just a few minutes to complete the survey questions. We read all of your comments and take your feedback very seriously. Thank you again for choosing our office.  Center for [employer] Healthcare Team at [facility] at [facility] ([address] [place], [place] [address]) Entrance C, located off of [address] 24/7 valet parking   Tips to Help You Sleep Better:  Get into a bedtime routine, try to do the same thing every night before going to bed to try to help your body wind down Warm baths Avoid caffeine for at least 3 hours before going to sleep  Keep your room at a slightly cooler temperature, can try running a fan Turn off TV, lights, phone, electronics Lots of pillows if needed to help you get comfortable Lavender scented items can help you sleep. You can place lavender essential oil on a cotton ball and place under your pillowcase, or place in a diffuser. [employer] has a lavender scented sleep line (plug-ins, sprays, etc). Look in the pillow aisle for lavender scented pillows.  If none of the above things help, you can try 1/2 to 1 tablet of benadryl, unisom, or tylenol pm. Do not take this every night, only when you really need it.    Go to [email or web address] to register for FREE online childbirth classes  Call the office [phone]) or go to [facility] if: You begin to severe cramping Your water breaks.  Sometimes it is a big gush of fluid, sometimes it is just a trickle that keeps getting your panties wet or running down your legs You have vaginal bleeding.  It is normal to have a small amount of spotting if your cervix was checked.   [facility] Pediatricians/Family Doctors [facility]):  [address] Colette Ribas, [phone]           [facility]: [address], [phone]                [facility]): [address], [phone] (call to ask if accepting patients) [facility] Department: [address], [place], [phone]    [facility] Pediatricians/Family Doctors [facility]): [phone] [address], [phone] [facility]: [address] [place], [phone] [facility]: [address], [phone]  [facility] Doctors  [facility]): [phone] [facility]: [address] [phone]   [facility] Doctors [facility]: [address] [phone]  [facility] Doctors  [facility]: [phone], [phone]  Home Blood Pressure Monitoring for Patients   Your provider has recommended that you check your blood pressure (BP) at least once a week at home. If you do not have a blood pressure cuff at home, one will be provided for you. Contact your provider if you have not received your monitor within 1 week.   Helpful Tips for Accurate Home Blood Pressure Checks  Don't smoke, exercise, or drink caffeine 30 minutes before checking your BP Use the restroom before checking your BP (a full bladder can raise your pressure) Relax in a comfortable upright chair Feet on the ground  Left arm resting comfortably on a flat surface at the level of your heart Legs uncrossed Back supported Sit quietly and don't talk Place the cuff on your bare arm Adjust snuggly, so that only two fingertips can fit between your skin and the top of the cuff Check 2 readings separated by at least one minute Keep a log of your BP readings For a visual, please reference this diagram: http://ccnc.care/bpdiagram  Provider Name: [facility]     Phone: [phone]  Zone  1: ALL CLEAR  Continue to monitor your symptoms:  BP reading is less than 140 (top number) or less than 90 (bottom number)  No right upper stomach pain No headaches or seeing spots No feeling nauseated or throwing up No swelling in face and hands  Zone 2: CAUTION Call your doctor's office for any of the following:  BP reading is greater than 140 (top number) or greater than 90 (bottom number)  Stomach pain under your ribs in the middle or right side Headaches or seeing spots Feeling nauseated or throwing up Swelling in face and hands  Zone 3: EMERGENCY  Seek immediate medical care if you have any of the following:  BP reading is greater than160 (top number) or greater than 110 (bottom number) Severe headaches not improving with Tylenol Serious difficulty catching your breath Any worsening symptoms from Zone 2     Second Trimester of Pregnancy The second trimester is from week 14 through week 27 (months 4 through 6). The second trimester is often a time when you feel your best. Your body has adjusted to being pregnant, and you begin to feel better physically. Usually, morning sickness has lessened or quit completely, you may have more energy, and you may have an increase in appetite. The second trimester is also a time when the fetus is growing rapidly. At the end of the sixth month, the fetus is about 9 inches long and weighs about 1 pounds. You will likely begin to feel the baby move (quickening) between 16 and 20 weeks of pregnancy. Body changes during your second trimester Your body continues to go through many changes during your second trimester. The changes vary from woman to woman. Your weight will continue to increase. You will notice your lower abdomen bulging out. You may begin to get stretch marks on your hips, abdomen, and breasts. You may develop headaches that can be relieved by medicines. The medicines should be approved by your health care provider. You may urinate  more often because the fetus is pressing on your bladder. You may develop or continue to have heartburn as a result of your pregnancy. You may develop constipation because certain hormones are causing the muscles that push waste through your intestines to slow down. You may develop hemorrhoids or swollen, bulging veins (varicose veins). You may have back pain. This is caused by: Weight gain. Pregnancy hormones that are relaxing the joints in your pelvis. A shift in weight and the muscles that support your balance. Your breasts will continue to grow and they will continue to become tender. Your gums may bleed and may be sensitive to brushing and flossing. Dark spots or blotches (chloasma, mask of pregnancy) may develop on your face. This will likely fade after the baby is born. A dark line from your belly button to the pubic area (linea nigra) may appear. This will likely fade after the baby is born. You may have changes in your hair. These can include thickening of your hair,  rapid growth, and changes in texture. Some women also have hair loss during or after pregnancy, or hair that feels dry or thin. Your hair will most likely return to normal after your baby is born.  What to expect at prenatal visits During a routine prenatal visit: You will be weighed to make sure you and the fetus are growing normally. Your blood pressure will be taken. Your abdomen will be measured to track your baby's growth. The fetal heartbeat will be listened to. Any test results from the previous visit will be discussed.  Your health care provider may ask you: How you are feeling. If you are feeling the baby move. If you have had any abnormal symptoms, such as leaking fluid, bleeding, severe headaches, or abdominal cramping. If you are using any tobacco products, including cigarettes, chewing tobacco, and electronic cigarettes. If you have any questions.  Other tests that may be performed during your second  trimester include: Blood tests that check for: Low iron levels (anemia). High blood sugar that affects pregnant women (gestational diabetes) between 88 and 28 weeks. Rh antibodies. This is to check for a protein on red blood cells (Rh factor). Urine tests to check for infections, diabetes, or protein in the urine. An ultrasound to confirm the proper growth and development of the baby. An amniocentesis to check for possible genetic problems. Fetal screens for spina bifida and Down syndrome. HIV (human immunodeficiency virus) testing. Routine prenatal testing includes screening for HIV, unless you choose not to have this test.  Follow these instructions at home: Medicines Follow your health care provider's instructions regarding medicine use. Specific medicines may be either safe or unsafe to take during pregnancy. Take a prenatal vitamin that contains at least 600 micrograms (mcg) of folic acid. If you develop constipation, try taking a stool softener if your health care provider approves. Eating and drinking Eat a balanced diet that includes fresh fruits and vegetables, whole grains, good sources of protein such as meat, eggs, or tofu, and low-fat dairy. Your health care provider will help you determine the amount of weight gain that is right for you. Avoid raw meat and uncooked cheese. These carry germs that can cause birth defects in the baby. If you have low calcium intake from food, talk to your health care provider about whether you should take a daily calcium supplement. Limit foods that are high in fat and processed sugars, such as fried and sweet foods. To prevent constipation: Drink enough fluid to keep your urine clear or pale yellow. Eat foods that are high in fiber, such as fresh fruits and vegetables, whole grains, and beans. Activity Exercise only as directed by your health care provider. Most women can continue their usual exercise routine during pregnancy. Try to exercise for  30 minutes at least 5 days a week. Stop exercising if you experience uterine contractions. Avoid heavy lifting, wear low heel shoes, and practice good posture. A sexual relationship may be continued unless your health care provider directs you otherwise. Relieving pain and discomfort Wear a good support bra to prevent discomfort from breast tenderness. Take warm sitz baths to soothe any pain or discomfort caused by hemorrhoids. Use hemorrhoid cream if your health care provider approves. Rest with your legs elevated if you have leg cramps or low back pain. If you develop varicose veins, wear support hose. Elevate your feet for 15 minutes, 3-4 times a day. Limit salt in your diet. Prenatal Care Write down your questions. Take them to your  prenatal visits. Keep all your prenatal visits as told by your health care provider. This is important. Safety Wear your seat belt at all times when driving. Make a list of emergency phone numbers, including numbers for family, friends, the hospital, and police and fire departments. General instructions Ask your health care provider for a referral to a local prenatal education class. Begin classes no later than the beginning of month 6 of your pregnancy. Ask for help if you have counseling or nutritional needs during pregnancy. Your health care provider can offer advice or refer you to specialists for help with various needs. Do not use hot tubs, steam rooms, or saunas. Do not douche or use tampons or scented sanitary pads. Do not cross your legs for long periods of time. Avoid cat litter boxes and soil used by cats. These carry germs that can cause birth defects in the baby and possibly loss of the fetus by miscarriage or stillbirth. Avoid all smoking, herbs, alcohol, and unprescribed drugs. Chemicals in these products can affect the formation and growth of the baby. Do not use any products that contain nicotine or tobacco, such as cigarettes and e-cigarettes.  If you need help quitting, ask your health care provider. Visit your dentist if you have not gone yet during your pregnancy. Use a soft toothbrush to brush your teeth and be gentle when you floss. Contact a health care provider if: You have dizziness. You have mild pelvic cramps, pelvic pressure, or nagging pain in the abdominal area. You have persistent nausea, vomiting, or diarrhea. You have a bad smelling vaginal discharge. You have pain when you urinate. Get help right away if: You have a fever. You are leaking fluid from your vagina. You have spotting or bleeding from your vagina. You have severe abdominal cramping or pain. You have rapid weight gain or weight loss. You have shortness of breath with chest pain. You notice sudden or extreme swelling of your face, hands, ankles, feet, or legs. You have not felt your baby move in over an hour. You have severe headaches that do not go away when you take medicine. You have vision changes. Summary The second trimester is from week 14 through week 27 (months 4 through 6). It is also a time when the fetus is growing rapidly. Your body goes through many changes during pregnancy. The changes vary from woman to woman. Avoid all smoking, herbs, alcohol, and unprescribed drugs. These chemicals affect the formation and growth your baby. Do not use any tobacco products, such as cigarettes, chewing tobacco, and e-cigarettes. If you need help quitting, ask your health care provider. Contact your health care provider if you have any questions. Keep all prenatal visits as told by your health care provider. This is important. This information is not intended to replace advice given to you by your health care provider. Make sure you discuss any questions you have with your health care provider. Document Released: 05/19/2001 Document Revised: 10/31/2015 Document Reviewed: 07/26/2012 Elsevier Interactive Patient Education  2017 [employer].

## 2023-05-20 LAB — INTEGRATED 2
AFP MoM: 1.11
Alpha-Fetoprotein: 25.1 ng/mL
Crown Rump Length: 66.4 mm
DIA MoM: 1.51
DIA Value: 184.3 pg/mL
Estriol, Unconjugated: 0.78 ng/mL
Gest. Age on Collection Date: 12.7 wk
Gestational Age: 16.7 wk
Maternal Age at EDD: 29.7 a
Nuchal Translucency (NT): 1.6 mm
Nuchal Translucency MoM: 0.86
Number of Fetuses: 1
PAPP-A MoM: 0.28
PAPP-A Value: 163.8 ng/mL
Sonographer ID#: 309760
Test Results:: NEGATIVE
Weight: 247 [lb_av]
Weight: 247 [lb_av]
hCG MoM: 0.85
hCG Value: 18.3 [IU]/mL
uE3 MoM: 0.93

## 2023-06-09 NOTE — L&D Delivery Note (Addendum)
 OB/GYN Faculty Practice Delivery Note  Theresa Brady is a 28 y.o. G2P1001 s/p SVD at [redacted]w[redacted]d. She was admitted for IOL for high PG BMI.   ROM: 7h 30m with clear fluid GBS Status:  Negative/-- (04/23 1100) Maximum Maternal Temperature: 98.32F  Labor Progress: Initial SVE: 4/80/-2. She then progressed to complete.   Delivery Date/Time: 10/20/23 @ 1937 Delivery: Called to room and patient was complete and pushing. Head delivered DOA. No nuchal cord present. Shoulder and body delivered in usual fashion in McRoberts position. Infant with spontaneous cry, placed on mother's abdomen, dried and stimulated. Cord clamped x 2 after 1-minute delay, and cut by FOB. Cord blood drawn. Placenta delivered spontaneously with gentle cord traction. Fundus firm with massage and Pitocin . Labia, perineum, vagina, and cervix inspected with no lacerations identified.  Mom and baby doing well.  Baby Weight: pending  Placenta: 3 vessel, intact. Sent to L&D Complications: None Lacerations: None EBL: 125 mL Analgesia: Epidural   Infant:  APGAR (1 MIN): 8  APGAR (5 MINS): 9   Frutoso Jing, MD [facility] Family Medicine Resident, [facility] for [facility], [facility] 10/20/2023, 7:46 PM   Evaluation and management procedures were performed by the [facility] Medicine Resident under my supervision. I was immediately available for direct supervision, assistance and direction throughout this encounter.  I also confirm that I have verified the information documented in the resident's note, and that I have also personally reperformed the pertinent components of the physical exam and all of the medical decision making activities.  I have also made any necessary editorial changes.   Candice Chalet, MD Family Medicine - Obstetrics Fellow

## 2023-06-15 ENCOUNTER — Ambulatory Visit: Payer: Managed Care, Other (non HMO)

## 2023-06-15 ENCOUNTER — Ambulatory Visit (INDEPENDENT_AMBULATORY_CARE_PROVIDER_SITE_OTHER): Payer: Managed Care, Other (non HMO) | Admitting: Obstetrics & Gynecology

## 2023-06-15 ENCOUNTER — Encounter: Payer: Self-pay | Admitting: Obstetrics & Gynecology

## 2023-06-15 VITALS — BP 100/69 | HR 79 | Wt 256.0 lb

## 2023-06-15 DIAGNOSIS — Z363 Encounter for antenatal screening for malformations: Secondary | ICD-10-CM

## 2023-06-15 DIAGNOSIS — Z720 Tobacco use: Secondary | ICD-10-CM

## 2023-06-15 DIAGNOSIS — Z3A21 21 weeks gestation of pregnancy: Secondary | ICD-10-CM | POA: Diagnosis not present

## 2023-06-15 DIAGNOSIS — O0990 Supervision of high risk pregnancy, unspecified, unspecified trimester: Secondary | ICD-10-CM

## 2023-06-15 DIAGNOSIS — O0992 Supervision of high risk pregnancy, unspecified, second trimester: Secondary | ICD-10-CM

## 2023-06-15 DIAGNOSIS — O99332 Smoking (tobacco) complicating pregnancy, second trimester: Secondary | ICD-10-CM

## 2023-06-15 DIAGNOSIS — Z6841 Body Mass Index (BMI) 40.0 and over, adult: Secondary | ICD-10-CM | POA: Diagnosis not present

## 2023-06-15 DIAGNOSIS — Z3480 Encounter for supervision of other normal pregnancy, unspecified trimester: Secondary | ICD-10-CM

## 2023-06-15 DIAGNOSIS — F17200 Nicotine dependence, unspecified, uncomplicated: Secondary | ICD-10-CM

## 2023-06-15 NOTE — Progress Notes (Signed)
 [place] 21 wks,cephalic,posterior placenta gr 0,normal ovaries,CX 4 cm,SVP of fluid 4 cm,FHR 154 bpm,EFW 400 g 51%,anatomy complete,no obvious abnormalities

## 2023-06-15 NOTE — Progress Notes (Signed)
   LOW-RISK PREGNANCY VISIT Patient name: Theresa Brady MRN [id number]  Date of birth: [birth year] Chief Complaint:   Routine Prenatal Visit  History of Present Illness:   Theresa Brady is a 28 y.o. G45P1001 female at [redacted]w[redacted]d with an Estimated Date of Delivery: 10/26/23 being seen today for ongoing management of a low-risk pregnancy.   Tobacco use- down to 5 per day     04/19/2023    1:41 PM 03/31/2023   10:17 AM  Depression screen PHQ 2/9  Decreased Interest 0 0  Down, Depressed, Hopeless 0 0  PHQ - 2 Score 0 0  Altered sleeping 1 1  Tired, decreased energy 1 0  Change in appetite 0 0  Feeling bad or failure about yourself  0 0  Trouble concentrating 0 0  Moving slowly or fidgety/restless 0 0  Suicidal thoughts 0 0  PHQ-9 Score 2 1    Today she reports  currently has a cold- taking OTC remedies . Contractions: Not present. Vag. Bleeding: None.  Movement: Present. denies leaking of fluid. Review of Systems:   Pertinent items are noted in HPI Denies abnormal vaginal discharge w/ itching/odor/irritation, headaches, visual changes, shortness of breath, chest pain, abdominal pain, severe nausea/vomiting, or problems with urination or bowel movements unless otherwise stated above. Pertinent History Reviewed:  Reviewed past medical,surgical, social, obstetrical and family history.  Reviewed problem list, medications and allergies.  Physical Assessment:   Vitals:   06/15/23 1043  BP: 100/69  Pulse: 79  Weight: 256 lb (116.1 kg)  Body mass index is 46.82 kg/m.        Physical Examination:   General appearance: Well appearing, and in no distress  Mental status: Alert, oriented to person, place, and time  Skin: Warm & dry  Respiratory: Normal respiratory effort, no distress  Abdomen: Soft, gravid, nontender  Pelvic: Cervical exam deferred         Extremities:  no edema  Psych:  mood and affect appropriate  Fetal Status:     Movement: Present    cephalic,posterior  placenta gr 0,normal ovaries,CX 4 cm,SVP of fluid 4 cm,FHR 154 bpm,EFW 400 g 51%,anatomy complete,no obvious abnormalities   Chaperone: n/a    No results found for this or any previous visit (from the past 24 hours).   Assessment & Plan:  1) Low-risk pregnancy G2P1001 at [redacted]w[redacted]d with an Estimated Date of Delivery: 10/26/23   -normal anatomy scan -tobacco use- continue to work on cutting down   Meds: No orders of the defined types were placed in this encounter.  Labs/procedures today: anatomy scan  Plan:  Continue routine obstetrical care  Next visit: prefers in person    Reviewed: Preterm labor symptoms and general obstetric precautions including but not limited to vaginal bleeding, contractions, leaking of fluid and fetal movement were reviewed in detail with the patient.  All questions were answered. Pt has home bp cuff. Check bp weekly, let us  know if >140/90.   Follow-up: Return in about 4 weeks (around 07/13/2023) for LROB visit.  No orders of the defined types were placed in this encounter.   Jakylan Ron, DO Attending Obstetrician & Gynecologist, [facility] for [employer], [facility]

## 2023-07-10 DIAGNOSIS — Z419 Encounter for procedure for purposes other than remedying health state, unspecified: Secondary | ICD-10-CM | POA: Diagnosis not present

## 2023-07-13 ENCOUNTER — Encounter: Payer: Self-pay | Admitting: Women's Health

## 2023-07-13 ENCOUNTER — Ambulatory Visit: Payer: Managed Care, Other (non HMO) | Admitting: Women's Health

## 2023-07-13 VITALS — BP 109/72 | HR 85 | Wt 257.0 lb

## 2023-07-13 DIAGNOSIS — O0992 Supervision of high risk pregnancy, unspecified, second trimester: Secondary | ICD-10-CM

## 2023-07-13 DIAGNOSIS — O0990 Supervision of high risk pregnancy, unspecified, unspecified trimester: Secondary | ICD-10-CM

## 2023-07-13 DIAGNOSIS — Z6841 Body Mass Index (BMI) 40.0 and over, adult: Secondary | ICD-10-CM

## 2023-07-13 DIAGNOSIS — Z3A25 25 weeks gestation of pregnancy: Secondary | ICD-10-CM

## 2023-07-13 NOTE — Patient Instructions (Signed)
Theresa Brady, thank you for choosing our office today! We appreciate the opportunity to meet your healthcare needs. You may receive a short survey by mail, e-mail, or through [employer]. If you are happy with your care we would appreciate if you could take just a few minutes to complete the survey questions. We read all of your comments and take your feedback very seriously. Thank you again for choosing our office.  Center for [employer] Team at [facility]  [facility] at [facility] ([address] [place], [place] [address]) Entrance C, located off of [address] Free 24/7 valet parking   You will have your sugar test next visit.  Please do not eat or drink anything after midnight the night before you come, not even water.  You will be here for at least two hours.  Please make an appointment online for the bloodwork at [email or web address] for 8:00am (or as close to this as possible). Make sure you select the [facility] service center.   CLASSES: Go to [email or web address] to register for classes (childbirth, breastfeeding, waterbirth, infant CPR, daddy bootcamp, etc.)  Call the office [phone]) or go to [facility] if: You begin to have strong, frequent contractions Your water breaks.  Sometimes it is a big gush of fluid, sometimes it is just a trickle that keeps getting your panties wet or running down your legs You have vaginal bleeding.  It is normal to have a small amount of spotting if your cervix was checked.  You don't feel your baby moving like normal.  If you don't, get you something to eat and drink and lay down and focus on feeling your baby move.   If your baby is still not moving like normal, you should call the office or go to [facility].  Call the office [phone]) or go to [facility] hospital for these signs of pre-eclampsia: Severe headache that does not go away with Tylenol Visual changes- seeing spots, double, blurred vision Pain under your right breast or upper  abdomen that does not go away with Tums or heartburn medicine Nausea and/or vomiting Severe swelling in your hands, feet, and face    [place] Pediatricians/Family Doctors [facility]): [address] Colette Ribas, [phone]           [facility]: [address], [phone]                [facility]): [address], [phone]  [facility] Department: [address], [place], [phone]    [facility] Pediatricians/Family Doctors [facility]): [address], [phone] [facility]: [address] [place], [phone] [facility]: [address], [phone]  [facility] Doctors  [facility]): [phone] [facility]: [address], [phone]   [facility] Doctors [facility]: [address] [phone]  [facility] Doctors  [facility]: [phone], [phone]  Home Blood Pressure Monitoring for Patients   Your provider has recommended that you check your blood pressure (BP) at least once a week at home. If you do not have a blood pressure cuff at home, one will be provided for you. Contact your provider if you have not received your monitor within 1 week.   Helpful Tips for Accurate Home Blood Pressure Checks  Don't smoke, exercise, or drink caffeine 30 minutes before checking  your BP Use the restroom before checking your BP (a full bladder can raise your pressure) Relax in a comfortable upright chair Feet on the ground Left arm resting comfortably on a flat surface at the level of your heart Legs uncrossed Back supported Sit quietly and don't talk Place the cuff on your bare arm Adjust snuggly, so that only two fingertips can fit between your skin and the top of the cuff Check 2 readings separated by at least one  minute Keep a log of your BP readings For a visual, please reference this diagram: http://ccnc.care/bpdiagram  Provider Name: [facility]     Phone: [phone]  Zone 1: ALL CLEAR  Continue to monitor your symptoms:  BP reading is less than 140 (top number) or less than 90 (bottom number)  No right upper stomach pain No headaches or seeing spots No feeling nauseated or throwing up No swelling in face and hands  Zone 2: CAUTION Call your doctor's office for any of the following:  BP reading is greater than 140 (top number) or greater than 90 (bottom number)  Stomach pain under your ribs in the middle or right side Headaches or seeing spots Feeling nauseated or throwing up Swelling in face and hands  Zone 3: EMERGENCY  Seek immediate medical care if you have any of the following:  BP reading is greater than160 (top number) or greater than 110 (bottom number) Severe headaches not improving with Tylenol Serious difficulty catching your breath Any worsening symptoms from Zone 2   Second Trimester of Pregnancy The second trimester is from week 13 through week 28, months 4 through 6. The second trimester is often a time when you feel your best. Your body has also adjusted to being pregnant, and you begin to feel better physically. Usually, morning sickness has lessened or quit completely, you may have more energy, and you may have an increase in appetite. The second trimester is also a time when the fetus is growing rapidly. At the end of the sixth month, the fetus is about 9 inches long and weighs about 1 pounds. You will likely begin to feel the baby move (quickening) between 18 and 20 weeks of the pregnancy. BODY CHANGES Your body goes through many changes during pregnancy. The changes vary from woman to woman.  Your weight will continue to increase. You will notice your lower abdomen bulging out. You may begin to get stretch marks on your hips, abdomen, and breasts. You may  develop headaches that can be relieved by medicines approved by your health care provider. You may urinate more often because the fetus is pressing on your bladder. You may develop or continue to have heartburn as a result of your pregnancy. You may develop constipation because certain hormones are causing the muscles that push waste through your intestines to slow down. You may develop hemorrhoids or swollen, bulging veins (varicose veins). You may have back pain because of the weight gain and pregnancy hormones relaxing your joints between the bones in your pelvis and as a result of a shift in weight and the muscles that support your balance. Your breasts will continue to grow and be tender. Your gums may bleed and may be sensitive to brushing and flossing. Dark spots or blotches (chloasma, mask of pregnancy) may develop on your face. This will likely fade after the baby is born. A dark line from your belly button to the pubic area (linea nigra) may appear. This will likely fade after the  baby is born. You may have changes in your hair. These can include thickening of your hair, rapid growth, and changes in texture. Some women also have hair loss during or after pregnancy, or hair that feels dry or thin. Your hair will most likely return to normal after your baby is born. WHAT TO EXPECT AT YOUR PRENATAL VISITS During a routine prenatal visit: You will be weighed to make sure you and the fetus are growing normally. Your blood pressure will be taken. Your abdomen will be measured to track your baby's growth. The fetal heartbeat will be listened to. Any test results from the previous visit will be discussed. Your health care provider may ask you: How you are feeling. If you are feeling the baby move. If you have had any abnormal symptoms, such as leaking fluid, bleeding, severe headaches, or abdominal cramping. If you have any questions. Other tests that may be performed during your second  trimester include: Blood tests that check for: Low iron levels (anemia). Gestational diabetes (between 24 and 28 weeks). Rh antibodies. Urine tests to check for infections, diabetes, or protein in the urine. An ultrasound to confirm the proper growth and development of the baby. An amniocentesis to check for possible genetic problems. Fetal screens for spina bifida and Down syndrome. HOME CARE INSTRUCTIONS  Avoid all smoking, herbs, alcohol, and unprescribed drugs. These chemicals affect the formation and growth of the baby. Follow your health care provider's instructions regarding medicine use. There are medicines that are either safe or unsafe to take during pregnancy. Exercise only as directed by your health care provider. Experiencing uterine cramps is a good sign to stop exercising. Continue to eat regular, healthy meals. Wear a good support bra for breast tenderness. Do not use hot tubs, steam rooms, or saunas. Wear your seat belt at all times when driving. Avoid raw meat, uncooked cheese, cat litter boxes, and soil used by cats. These carry germs that can cause birth defects in the baby. Take your prenatal vitamins. Try taking a stool softener (if your health care provider approves) if you develop constipation. Eat more high-fiber foods, such as fresh vegetables or fruit and whole grains. Drink plenty of fluids to keep your urine clear or pale yellow. Take warm sitz baths to soothe any pain or discomfort caused by hemorrhoids. Use hemorrhoid cream if your health care provider approves. If you develop varicose veins, wear support hose. Elevate your feet for 15 minutes, 3-4 times a day. Limit salt in your diet. Avoid heavy lifting, wear low heel shoes, and practice good posture. Rest with your legs elevated if you have leg cramps or low back pain. Visit your dentist if you have not gone yet during your pregnancy. Use a soft toothbrush to brush your teeth and be gentle when you floss. A  sexual relationship may be continued unless your health care provider directs you otherwise. Continue to go to all your prenatal visits as directed by your health care provider. SEEK MEDICAL CARE IF:  You have dizziness. You have mild pelvic cramps, pelvic pressure, or nagging pain in the abdominal area. You have persistent nausea, vomiting, or diarrhea. You have a bad smelling vaginal discharge. You have pain with urination. SEEK IMMEDIATE MEDICAL CARE IF:  You have a fever. You are leaking fluid from your vagina. You have spotting or bleeding from your vagina. You have severe abdominal cramping or pain. You have rapid weight gain or loss. You have shortness of breath with chest pain. You  notice sudden or extreme swelling of your face, hands, ankles, feet, or legs. You have not felt your baby move in over an hour. You have severe headaches that do not go away with medicine. You have vision changes. Document Released: 05/19/2001 Document Revised: 05/30/2013 Document Reviewed: 07/26/2012 [facility] Patient Information 2015 [place], [place]. This information is not intended to replace advice given to you by your health care provider. Make sure you discuss any questions you have with your health care provider.

## 2023-07-13 NOTE — Progress Notes (Signed)
 HIGH-RISK PREGNANCY VISIT Patient name: Theresa Brady MRN [id number]  Date of birth: 16-Mar-1996 Chief Complaint:   Routine Prenatal Visit  History of Present Illness:   Theresa Brady is a 28 y.o. G65P1001 female at [redacted]w[redacted]d with an Estimated Date of Delivery: 10/26/23 being seen today for ongoing management of a high-risk pregnancy complicated by PG BMI 44.    Today she reports no complaints. Contractions: Not present. Vag. Bleeding: None.  Movement: Present. denies leaking of fluid.      04/19/2023    1:41 PM 03/31/2023   10:17 AM  Depression screen PHQ 2/9  Decreased Interest 0 0  Down, Depressed, Hopeless 0 0  PHQ - 2 Score 0 0  Altered sleeping 1 1  Tired, decreased energy 1 0  Change in appetite 0 0  Feeling bad or failure about yourself  0 0  Trouble concentrating 0 0  Moving slowly or fidgety/restless 0 0  Suicidal thoughts 0 0  PHQ-9 Score 2 1        04/19/2023    1:41 PM 03/31/2023   10:17 AM  GAD 7 : Generalized Anxiety Score  Nervous, Anxious, on Edge 1 1  Control/stop worrying 1 0  Worry too much - different things 1 0  Trouble relaxing 0 0  Restless 0 0  Easily annoyed or irritable 1 0  Afraid - awful might happen 1 0  Total GAD 7 Score 5 1     Review of Systems:   Pertinent items are noted in HPI Denies abnormal vaginal discharge w/ itching/odor/irritation, headaches, visual changes, shortness of breath, chest pain, abdominal pain, severe nausea/vomiting, or problems with urination or bowel movements unless otherwise stated above. Pertinent History Reviewed:  Reviewed past medical,surgical, social, obstetrical and family history.  Reviewed problem list, medications and allergies. Physical Assessment:   Vitals:   07/13/23 0852  BP: 109/72  Pulse: 85  Weight: 257 lb (116.6 kg)  Body mass index is 47.01 kg/m.           Physical Examination:   General appearance: alert, well appearing, and in no distress  Mental status: alert, oriented to  person, place, and time  Skin: warm & dry   Extremities:      Cardiovascular: normal heart rate noted  Respiratory: normal respiratory effort, no distress  Abdomen: gravid, soft, non-tender  Pelvic: Cervical exam deferred         Fetal Status: Fetal Heart Rate (bpm): 147 Fundal Height: 28 cm Movement: Present    Fetal Surveillance Testing today: doppler  Chaperone: N/A    No results found for this or any previous visit (from the past 24 hours).  Assessment & Plan:  High-risk pregnancy: G2P1001 at [redacted]w[redacted]d with an Estimated Date of Delivery: 10/26/23   1) PG BMI 44, currently 47  Meds: No orders of the defined types were placed in this encounter.   Labs/procedures today: declined flu shot  Treatment Plan:  U/S q4wks   2x/wk nst or weekly BPP @ 34wks   Deliver @ 39-40.6wks   Reviewed: Preterm labor symptoms and general obstetric precautions including but not limited to vaginal bleeding, contractions, leaking of fluid and fetal movement were reviewed in detail with the patient.  All questions were answered. Does have home bp cuff. Office bp cuff given: not applicable. Check bp weekly, let us  know if consistently >140 and/or >90.  Follow-up: Return in about 3 weeks (around 08/03/2023) for HROB, PN2, US :EFW, MD or CNM, in person.  No future appointments.  Orders Placed This Encounter  Procedures   US  OB Follow Up   Suzen JONELLE Fetters CNM, [facility] 07/13/2023 9:12 AM

## 2023-07-23 ENCOUNTER — Encounter: Payer: Self-pay | Admitting: Women's Health

## 2023-08-04 ENCOUNTER — Other Ambulatory Visit: Payer: Medicaid Other

## 2023-08-04 ENCOUNTER — Encounter: Payer: Self-pay | Admitting: Women's Health

## 2023-08-04 ENCOUNTER — Ambulatory Visit: Payer: Medicaid Other | Admitting: Women's Health

## 2023-08-04 ENCOUNTER — Ambulatory Visit: Payer: Medicaid Other | Admitting: Radiology

## 2023-08-04 VITALS — BP 96/68 | HR 88 | Wt 257.0 lb

## 2023-08-04 DIAGNOSIS — O0993 Supervision of high risk pregnancy, unspecified, third trimester: Secondary | ICD-10-CM

## 2023-08-04 DIAGNOSIS — Z3A28 28 weeks gestation of pregnancy: Secondary | ICD-10-CM | POA: Diagnosis not present

## 2023-08-04 DIAGNOSIS — O0990 Supervision of high risk pregnancy, unspecified, unspecified trimester: Secondary | ICD-10-CM

## 2023-08-04 DIAGNOSIS — Z131 Encounter for screening for diabetes mellitus: Secondary | ICD-10-CM | POA: Diagnosis not present

## 2023-08-04 DIAGNOSIS — Z3A27 27 weeks gestation of pregnancy: Secondary | ICD-10-CM | POA: Diagnosis not present

## 2023-08-04 DIAGNOSIS — Z6841 Body Mass Index (BMI) 40.0 and over, adult: Secondary | ICD-10-CM

## 2023-08-04 DIAGNOSIS — O0992 Supervision of high risk pregnancy, unspecified, second trimester: Secondary | ICD-10-CM

## 2023-08-04 NOTE — Progress Notes (Signed)
 HIGH-RISK PREGNANCY VISIT Patient name: Theresa Brady MRN [id number]  Date of birth: 06-May-1996 Chief Complaint:   Routine Prenatal Visit (Ultrasound, PN2)  History of Present Illness:   Theresa Brady is a 28 y.o. G15P1001 female at [redacted]w[redacted]d with an Estimated Date of Delivery: 10/26/23 being seen today for ongoing management of a high-risk pregnancy complicated by PG BMI 44.    Today she reports no complaints. Contractions: Not present. Vag. Bleeding: None.  Movement: Present. denies leaking of fluid.      04/19/2023    1:41 PM 03/31/2023   10:17 AM  Depression screen PHQ 2/9  Decreased Interest 0 0  Down, Depressed, Hopeless 0 0  PHQ - 2 Score 0 0  Altered sleeping 1 1  Tired, decreased energy 1 0  Change in appetite 0 0  Feeling bad or failure about yourself  0 0  Trouble concentrating 0 0  Moving slowly or fidgety/restless 0 0  Suicidal thoughts 0 0  PHQ-9 Score 2 1        04/19/2023    1:41 PM 03/31/2023   10:17 AM  GAD 7 : Generalized Anxiety Score  Nervous, Anxious, on Edge 1 1  Control/stop worrying 1 0  Worry too much - different things 1 0  Trouble relaxing 0 0  Restless 0 0  Easily annoyed or irritable 1 0  Afraid - awful might happen 1 0  Total GAD 7 Score 5 1     Review of Systems:   Pertinent items are noted in HPI Denies abnormal vaginal discharge w/ itching/odor/irritation, headaches, visual changes, shortness of breath, chest pain, abdominal pain, severe nausea/vomiting, or problems with urination or bowel movements unless otherwise stated above. Pertinent History Reviewed:  Reviewed past medical,surgical, social, obstetrical and family history.  Reviewed problem list, medications and allergies. Physical Assessment:   Vitals:   08/04/23 0954  BP: 96/68  Pulse: 88  Weight: 257 lb (116.6 kg)  Body mass index is 47.01 kg/m.           Physical Examination:   General appearance: alert, well appearing, and in no distress  Mental status: alert,  oriented to person, place, and time  Skin: warm & dry   Extremities: Edema: None    Cardiovascular: normal heart rate noted  Respiratory: normal respiratory effort, no distress  Abdomen: gravid, soft, non-tender  Pelvic: Cervical exam deferred         Fetal Status:     Movement: Present    Fetal Surveillance Testing today:  [place]:  GA = 28+1 weeks  Single active female fetus,  cephalic,  FHR = 782 bpm  Posterior fundal pl, gr1 AFI = 12.8 cm  29%,  MVP = 4.5 cm  EFW 39%  AC 31%  1188g, nl ov's, cervix long, closed  Chaperone: N/A    No results found for this or any previous visit (from the past 24 hours).  Assessment & Plan:  High-risk pregnancy: G2P1001 at [redacted]w[redacted]d with an Estimated Date of Delivery: 10/26/23   1) PG BMI 44  Meds: No orders of the defined types were placed in this encounter.   Labs/procedures today: U/S, PN2, and declined Tdap  Treatment Plan:  U/S q4wks   2x/wk nst or weekly BPP @ 34wks   Deliver @ 39-40wks   Reviewed: Preterm labor symptoms and general obstetric precautions including but not limited to vaginal bleeding, contractions, leaking of fluid and fetal movement were reviewed in detail with the patient.  All  questions were answered. Does have home bp cuff. Office bp cuff given: not applicable. Check bp weekly, let us know if consistently >140 and/or >90.  Follow-up: Return in about 2 weeks (around 08/18/2023) for HROB, CNM, in person; 4wks from now EFW u/s and HROB, then weekly BPP/HROB @ 34wks.   No future appointments.   No orders of the defined types were placed in this encounter.  Cheral Marker CNM, [facility] 08/04/2023 10:30 AM

## 2023-08-04 NOTE — Progress Notes (Signed)
 [place]:  GA = 28+1 weeks  Single active female fetus,  cephalic,  FHR = 098 bpm  Posterior fundal pl, gr1 AFI = 12.8 cm  29%,  MVP = 4.5 cm  EFW 39%  AC 31%  1188g, nl ov's, cervix long, closed

## 2023-08-04 NOTE — Patient Instructions (Signed)
Theresa Brady, thank you for choosing our office today! We appreciate the opportunity to meet your healthcare needs. You may receive a short survey by mail, e-mail, or through MyChart. If you are happy with your care we would appreciate if you could take just a few minutes to complete the survey questions. We read all of your comments and take your feedback very seriously. Thank you again for choosing our office.  [facility]  [facility] ([address] [place], [place] [address]) Entrance C, located off of [address] Free 24/7 valet parking   CLASSES: Go to [email or web address] to register for classes (childbirth, breastfeeding, waterbirth, infant CPR, daddy bootcamp, etc.)  Call the office ([phone]) or go to [facility] if: You begin to have strong, frequent contractions Your water breaks.  Sometimes it is a big gush of fluid, sometimes it is just a trickle that keeps getting your panties wet or running down your legs You have vaginal bleeding.  It is normal to have a small amount of spotting if your cervix was checked.  You don't feel your baby moving like normal.  If you don't, get you something to eat and drink and lay down and focus on feeling your baby move.   If your baby is still not moving like normal, you should call the office or go to [facility].  Call the office ([phone]) or go to [facility] for these signs of pre-eclampsia: Severe headache that does not go away with Tylenol Visual changes- seeing spots, double, blurred vision Pain under your right breast or upper abdomen that does not go away with Tums or heartburn medicine Nausea and/or vomiting Severe swelling in your hands, feet, and face   Tdap Vaccine It is recommended that you get the Tdap vaccine during the third trimester of EACH pregnancy to help protect your baby from getting pertussis (whooping cough) 27-36 weeks is the BEST time to do  this so that you can pass the protection on to your baby. During pregnancy is better than after pregnancy, but if you are unable to get it during pregnancy it will be offered at the hospital.  You can get this vaccine with us, at the health department, your family doctor, or some local pharmacies Everyone who will be around your baby should also be up-to-date on their vaccines before the baby comes. Adults (who are not pregnant) only need 1 dose of Tdap during adulthood.   [place] Pediatricians/Family Doctors [facility] ([facility]): [address], [phone]           [facility]: [address], [phone]                [facility] ([facility]): [address], [phone] (call to ask if accepting patients) [facility]: [address], [place], [phone]    [place] Pediatricians/Family Doctors [facility] ([facility]): [address], [phone] [facility]: [address], [phone] [facility]: [address], [phone]  [place] [facility]  [facility] ([facility]): [phone] [facility]: [address], [phone]   [place] [facility]: [address], [phone]  [place] [facility]  [facility]: [address], [phone]  Home Blood Pressure Monitoring for Patients   Your provider has recommended that you check your   blood pressure (BP) at least once a week at home. If you do not have a blood pressure cuff at home, one will be provided for you. Contact your provider if you have not received your monitor within 1 week.   Helpful Tips for Accurate Home Blood Pressure Checks  Don't smoke, exercise, or drink caffeine 30 minutes before checking your BP Use the restroom before checking your BP (a full bladder can raise your  pressure) Relax in a comfortable upright chair Feet on the ground Left arm resting comfortably on a flat surface at the level of your heart Legs uncrossed Back supported Sit quietly and don't talk Place the cuff on your bare arm Adjust snuggly, so that only two fingertips can fit between your skin and the top of the cuff Check 2 readings separated by at least one minute Keep a log of your BP readings For a visual, please reference this diagram: http://ccnc.care/bpdiagram  Provider Name: [facility]     Phone: [phone]  Zone 1: ALL CLEAR  Continue to monitor your symptoms:  BP reading is less than 140 (top number) or less than 90 (bottom number)  No right upper stomach pain No headaches or seeing spots No feeling nauseated or throwing up No swelling in face and hands  Zone 2: CAUTION Call your doctor's office for any of the following:  BP reading is greater than 140 (top number) or greater than 90 (bottom number)  Stomach pain under your ribs in the middle or right side Headaches or seeing spots Feeling nauseated or throwing up Swelling in face and hands  Zone 3: EMERGENCY  Seek immediate medical care if you have any of the following:  BP reading is greater than160 (top number) or greater than 110 (bottom number) Severe headaches not improving with Tylenol Serious difficulty catching your breath Any worsening symptoms from Zone 2   Third Trimester of Pregnancy The third trimester is from week 29 through week 42, months 7 through 9. The third trimester is a time when the fetus is growing rapidly. At the end of the ninth month, the fetus is about 20 inches in length and weighs 6-10 pounds.  BODY CHANGES Your body goes through many changes during pregnancy. The changes vary from woman to woman.  Your weight will continue to increase. You can expect to gain 25-35 pounds (11-16 kg) by the end of the pregnancy. You may begin to get stretch marks on your hips, abdomen,  and breasts. You may urinate more often because the fetus is moving lower into your pelvis and pressing on your bladder. You may develop or continue to have heartburn as a result of your pregnancy. You may develop constipation because certain hormones are causing the muscles that push waste through your intestines to slow down. You may develop hemorrhoids or swollen, bulging veins (varicose veins). You may have pelvic pain because of the weight gain and pregnancy hormones relaxing your joints between the bones in your pelvis. Backaches may result from overexertion of the muscles supporting your posture. You may have changes in your hair. These can include thickening of your hair, rapid growth, and changes in texture. Some women also have hair loss during or after pregnancy, or hair that feels dry or thin. Your hair will most likely return to normal after your baby is born. Your breasts will continue to grow and be tender. A yellow discharge may leak from your breasts called colostrum. Your belly button may stick out. You may   feel short of breath because of your expanding uterus. You may notice the fetus "dropping," or moving lower in your abdomen. You may have a bloody mucus discharge. This usually occurs a few days to a week before labor begins. Your cervix becomes thin and soft (effaced) near your due date. WHAT TO EXPECT AT YOUR PRENATAL EXAMS  You will have prenatal exams every 2 weeks until week 36. Then, you will have weekly prenatal exams. During a routine prenatal visit: You will be weighed to make sure you and the fetus are growing normally. Your blood pressure is taken. Your abdomen will be measured to track your baby's growth. The fetal heartbeat will be listened to. Any test results from the previous visit will be discussed. You may have a cervical check near your due date to see if you have effaced. At around 36 weeks, your caregiver will check your cervix. At the same time, your  caregiver will also perform a test on the secretions of the vaginal tissue. This test is to determine if a type of bacteria, Group B streptococcus, is present. Your caregiver will explain this further. Your caregiver may ask you: What your birth plan is. How you are feeling. If you are feeling the baby move. If you have had any abnormal symptoms, such as leaking fluid, bleeding, severe headaches, or abdominal cramping. If you have any questions. Other tests or screenings that may be performed during your third trimester include: Blood tests that check for low iron levels (anemia). Fetal testing to check the health, activity level, and growth of the fetus. Testing is done if you have certain medical conditions or if there are problems during the pregnancy. FALSE LABOR You may feel small, irregular contractions that eventually go away. These are called Braxton Hicks contractions, or false labor. Contractions may last for hours, days, or even weeks before true labor sets in. If contractions come at regular intervals, intensify, or become painful, it is best to be seen by your caregiver.  SIGNS OF LABOR  Menstrual-like cramps. Contractions that are 5 minutes apart or less. Contractions that start on the top of the uterus and spread down to the lower abdomen and back. A sense of increased pelvic pressure or back pain. A watery or bloody mucus discharge that comes from the vagina. If you have any of these signs before the 37th week of pregnancy, call your caregiver right away. You need to go to the hospital to get checked immediately. HOME CARE INSTRUCTIONS  Avoid all smoking, herbs, alcohol, and unprescribed drugs. These chemicals affect the formation and growth of the baby. Follow your caregiver's instructions regarding medicine use. There are medicines that are either safe or unsafe to take during pregnancy. Exercise only as directed by your caregiver. Experiencing uterine cramps is a good sign to  stop exercising. Continue to eat regular, healthy meals. Wear a good support bra for breast tenderness. Do not use hot tubs, steam rooms, or saunas. Wear your seat belt at all times when driving. Avoid raw meat, uncooked cheese, cat litter boxes, and soil used by cats. These carry germs that can cause birth defects in the baby. Take your prenatal vitamins. Try taking a stool softener (if your caregiver approves) if you develop constipation. Eat more high-fiber foods, such as fresh vegetables or fruit and whole grains. Drink plenty of fluids to keep your urine clear or pale yellow. Take warm sitz baths to soothe any pain or discomfort caused by hemorrhoids. Use hemorrhoid cream if   your caregiver approves. If you develop varicose veins, wear support hose. Elevate your feet for 15 minutes, 3-4 times a day. Limit salt in your diet. Avoid heavy lifting, wear low heal shoes, and practice good posture. Rest a lot with your legs elevated if you have leg cramps or low back pain. Visit your dentist if you have not gone during your pregnancy. Use a soft toothbrush to brush your teeth and be gentle when you floss. A sexual relationship may be continued unless your caregiver directs you otherwise. Do not travel far distances unless it is absolutely necessary and only with the approval of your caregiver. Take prenatal classes to understand, practice, and ask questions about the labor and delivery. Make a trial run to the hospital. Pack your hospital bag. Prepare the baby's nursery. Continue to go to all your prenatal visits as directed by your caregiver. SEEK MEDICAL CARE IF: You are unsure if you are in labor or if your water has broken. You have dizziness. You have mild pelvic cramps, pelvic pressure, or nagging pain in your abdominal area. You have persistent nausea, vomiting, or diarrhea. You have a bad smelling vaginal discharge. You have pain with urination. SEEK IMMEDIATE MEDICAL CARE IF:  You  have a fever. You are leaking fluid from your vagina. You have spotting or bleeding from your vagina. You have severe abdominal cramping or pain. You have rapid weight loss or gain. You have shortness of breath with chest pain. You notice sudden or extreme swelling of your face, hands, ankles, feet, or legs. You have not felt your baby move in over an hour. You have severe headaches that do not go away with medicine. You have vision changes. Document Released: 05/19/2001 Document Revised: 05/30/2013 Document Reviewed: 07/26/2012 ExitCare Patient Information 2015 [employer]. This information is not intended to replace advice given to you by your health care provider. Make sure you discuss any questions you have with your health care provider.        

## 2023-08-05 ENCOUNTER — Encounter: Payer: Self-pay | Admitting: Women's Health

## 2023-08-05 LAB — CBC
Hematocrit: 37 % (ref 34.0–46.6)
Hemoglobin: 12.2 g/dL (ref 11.1–15.9)
MCH: 29.8 pg (ref 26.6–33.0)
MCHC: 33 g/dL (ref 31.5–35.7)
MCV: 91 fL (ref 79–97)
Platelets: 212 10*3/uL (ref 150–450)
RBC: 4.09 x10E6/uL (ref 3.77–5.28)
RDW: 12.1 % (ref 11.7–15.4)
WBC: 5.3 10*3/uL (ref 3.4–10.8)

## 2023-08-05 LAB — GLUCOSE TOLERANCE, 2 HOURS W/ 1HR
Glucose, 1 hour: 141 mg/dL (ref 70–179)
Glucose, 2 hour: 117 mg/dL (ref 70–152)
Glucose, Fasting: 85 mg/dL (ref 70–91)

## 2023-08-05 LAB — RPR: RPR Ser Ql: NONREACTIVE

## 2023-08-05 LAB — ANTIBODY SCREEN: Antibody Screen: NEGATIVE

## 2023-08-05 LAB — HIV ANTIBODY (ROUTINE TESTING W REFLEX): HIV Screen 4th Generation wRfx: NONREACTIVE

## 2023-08-07 DIAGNOSIS — Z419 Encounter for procedure for purposes other than remedying health state, unspecified: Secondary | ICD-10-CM | POA: Diagnosis not present

## 2023-08-17 ENCOUNTER — Encounter: Payer: Self-pay | Admitting: Women's Health

## 2023-08-17 ENCOUNTER — Ambulatory Visit: Admitting: Women's Health

## 2023-08-17 VITALS — BP 98/67 | HR 84 | Wt 257.8 lb

## 2023-08-17 DIAGNOSIS — O0993 Supervision of high risk pregnancy, unspecified, third trimester: Secondary | ICD-10-CM

## 2023-08-17 DIAGNOSIS — Z6841 Body Mass Index (BMI) 40.0 and over, adult: Secondary | ICD-10-CM | POA: Diagnosis not present

## 2023-08-17 DIAGNOSIS — Z3A30 30 weeks gestation of pregnancy: Secondary | ICD-10-CM | POA: Diagnosis not present

## 2023-08-17 DIAGNOSIS — O0990 Supervision of high risk pregnancy, unspecified, unspecified trimester: Secondary | ICD-10-CM

## 2023-08-17 NOTE — Patient Instructions (Signed)
Theresa Brady, thank you for choosing our office today! We appreciate the opportunity to meet your healthcare needs. You may receive a short survey by mail, e-mail, or through MyChart. If you are happy with your care we would appreciate if you could take just a few minutes to complete the survey questions. We read all of your comments and take your feedback very seriously. Thank you again for choosing our office.  [facility]  [facility] ([address] [place], [place] [address]) Entrance C, located off of [address] Free 24/7 valet parking   CLASSES: Go to [email or web address] to register for classes (childbirth, breastfeeding, waterbirth, infant CPR, daddy bootcamp, etc.)  Call the office ([phone]) or go to [facility] if: You begin to have strong, frequent contractions Your water breaks.  Sometimes it is a big gush of fluid, sometimes it is just a trickle that keeps getting your panties wet or running down your legs You have vaginal bleeding.  It is normal to have a small amount of spotting if your cervix was checked.  You don't feel your baby moving like normal.  If you don't, get you something to eat and drink and lay down and focus on feeling your baby move.   If your baby is still not moving like normal, you should call the office or go to [facility].  Call the office ([phone]) or [facility] for these signs of pre-eclampsia: Severe headache that does not go away with Tylenol Visual changes- seeing spots, double, blurred vision Pain under your right breast or upper abdomen that does not go away with Tums or heartburn medicine Nausea and/or vomiting Severe swelling in your hands, feet, and face   Tdap Vaccine It is recommended that you get the Tdap vaccine during the third trimester of EACH pregnancy to help protect your baby from getting pertussis (whooping cough) 27-36 weeks is the BEST time to do  this so that you can pass the protection on to your baby. During pregnancy is better than after pregnancy, but if you are unable to get it during pregnancy it will be offered at the hospital.  You can get this vaccine with us, at the health department, your family doctor, or some local pharmacies Everyone who will be around your baby should also be up-to-date on their vaccines before the baby comes. Adults (who are not pregnant) only need 1 dose of Tdap during adulthood.   [place] Pediatricians/Family Doctors [facility] ([facility]): [address], [phone]           [facility]: [address], [phone]                [facility] ([facility]): [address], [phone] (call to ask if accepting patients) [facility]: [address], [place], [phone]    [place] Pediatricians/Family Doctors [facility] ([facility]): [address], [phone] [facility]: [address], [phone] [facility]: [address], [phone]  [place] [facility]  [facility] ([facility]): [phone] [facility]: [address], [phone]   [place] [facility] Doctors [facility]: [address], [phone]  [place] [facility]  [facility]: [address], [phone]  Home Blood Pressure Monitoring for Patients   Your provider has recommended that you check your   blood pressure (BP) at least once a week at home. If you do not have a blood pressure cuff at home, one will be provided for you. Contact your provider if you have not received your monitor within 1 week.  ° °Helpful Tips for Accurate Home Blood Pressure Checks  °Don't smoke, exercise, or drink caffeine 30 minutes before checking your BP °Use the restroom before checking your BP (a full bladder can raise your  pressure) °Relax in a comfortable upright chair °Feet on the ground °Left arm resting comfortably on a flat surface at the level of your heart °Legs uncrossed °Back supported °Sit quietly and don't talk °Place the cuff on your bare arm °Adjust snuggly, so that only two fingertips can fit between your skin and the top of the cuff °Check 2 readings separated by at least one minute °Keep a log of your BP readings °For a visual, please reference this diagram: http://ccnc.care/bpdiagram ° °Provider Name: [facility]     Phone: [phone] ° °Zone 1: ALL CLEAR  °Continue to monitor your symptoms:  °BP reading is less than 140 (top number) or less than 90 (bottom number)  °No right upper stomach pain °No headaches or seeing spots °No feeling nauseated or throwing up °No swelling in face and hands ° °Zone 2: CAUTION °Call your doctor's office for any of the following:  °BP reading is greater than 140 (top number) or greater than 90 (bottom number)  °Stomach pain under your ribs in the middle or right side °Headaches or seeing spots °Feeling nauseated or throwing up °Swelling in face and hands ° °Zone 3: EMERGENCY  °Seek immediate medical care if you have any of the following:  °BP reading is greater than160 (top number) or greater than 110 (bottom number) °Severe headaches not improving with Tylenol °Serious difficulty catching your breath °Any worsening symptoms from Zone 2  °Preterm Labor and Birth Information ° °The normal length of a pregnancy is 39-41 weeks. Preterm labor is when labor starts before 37 completed weeks of pregnancy. °What are the risk factors for preterm labor? °Preterm labor is more likely to occur in women who: °Have certain infections during pregnancy such as a bladder infection, sexually transmitted infection, or infection inside the uterus (chorioamnionitis). °Have a shorter-than-normal cervix. °Have gone into preterm labor before. °Have had surgery on their cervix. °Are younger than age 17  or older than age 35. °Are African American. °Are pregnant with twins or multiple babies (multiple gestation). °Take street drugs or smoke while pregnant. °Do not gain enough weight while pregnant. °Became pregnant shortly after having been pregnant. °What are the symptoms of preterm labor? °Symptoms of preterm labor include: °Cramps similar to those that can happen during a menstrual period. The cramps may happen with diarrhea. °Pain in the abdomen or lower back. °Regular uterine contractions that may feel like tightening of the abdomen. °A feeling of increased pressure in the pelvis. °Increased watery or bloody mucus discharge from the vagina. °Water breaking (ruptured amniotic sac). °Why is it important to recognize signs of preterm labor? °It is important to recognize signs of preterm labor because babies who are born prematurely may not be fully developed. This can put them at an increased risk for: °Long-term (chronic) heart and lung problems. °Difficulty immediately after birth with regulating body systems, including blood sugar, body temperature, heart rate, and breathing rate. °Bleeding in the brain. °Cerebral palsy. °Learning difficulties. °Death. °These risks are highest for babies who are born before 34 weeks   of pregnancy. How is preterm labor treated? Treatment depends on the length of your pregnancy, your condition, and the health of your baby. It may involve: Having a stitch (suture) placed in your cervix to prevent your cervix from opening too early (cerclage). Taking or being given medicines, such as: Hormone medicines. These may be given early in pregnancy to help support the pregnancy. Medicine to stop contractions. Medicines to help mature the babys lungs. These may be prescribed if the risk of delivery is high. Medicines to prevent your baby from developing cerebral palsy. If the labor happens before 34 weeks of pregnancy, you may need to stay in the hospital. What should I do if I  think I am in preterm labor? If you think that you are going into preterm labor, call your health care provider right away. How can I prevent preterm labor in future pregnancies? To increase your chance of having a full-term pregnancy: Do not use any tobacco products, such as cigarettes, chewing tobacco, and e-cigarettes. If you need help quitting, ask your health care provider. Do not use street drugs or medicines that have not been prescribed to you during your pregnancy. Talk with your health care provider before taking any herbal supplements, even if you have been taking them regularly. Make sure you gain a healthy amount of weight during your pregnancy. Watch for infection. If you think that you might have an infection, get it checked right away. Make sure to tell your health care provider if you have gone into preterm labor before. This information is not intended to replace advice given to you by your health care provider. Make sure you discuss any questions you have with your health care provider. Document Revised: 09/16/2018 Document Reviewed: 10/16/2015 Elsevier Patient Education  [place].

## 2023-08-17 NOTE — Progress Notes (Signed)
 HIGH-RISK PREGNANCY VISIT Patient name: Theresa Brady MRN [id number]  Date of birth: Oct 22, 1995 Chief Complaint:   Routine Prenatal Visit  History of Present Illness:   Theresa Brady is a 28 y.o. G60P1001 female at [redacted]w[redacted]d with an Estimated Date of Delivery: 10/26/23 being seen today for ongoing management of a high-risk pregnancy complicated by PG BMI 44.    Today she reports  movements aren't as forceful, still moves a lot-discussed . Contractions: Not present.  .  Movement: Present. denies leaking of fluid.      04/19/2023    1:41 PM 03/31/2023   10:17 AM  Depression screen PHQ 2/9  Decreased Interest 0 0  Down, Depressed, Hopeless 0 0  PHQ - 2 Score 0 0  Altered sleeping 1 1  Tired, decreased energy 1 0  Change in appetite 0 0  Feeling bad or failure about yourself  0 0  Trouble concentrating 0 0  Moving slowly or fidgety/restless 0 0  Suicidal thoughts 0 0  PHQ-9 Score 2 1        04/19/2023    1:41 PM 03/31/2023   10:17 AM  GAD 7 : Generalized Anxiety Score  Nervous, Anxious, on Edge 1 1  Control/stop worrying 1 0  Worry too much - different things 1 0  Trouble relaxing 0 0  Restless 0 0  Easily annoyed or irritable 1 0  Afraid - awful might happen 1 0  Total GAD 7 Score 5 1     Review of Systems:   Pertinent items are noted in HPI Denies abnormal vaginal discharge w/ itching/odor/irritation, headaches, visual changes, shortness of breath, chest pain, abdominal pain, severe nausea/vomiting, or problems with urination or bowel movements unless otherwise stated above. Pertinent History Reviewed:  Reviewed past medical,surgical, social, obstetrical and family history.  Reviewed problem list, medications and allergies. Physical Assessment:   Vitals:   08/17/23 1201  BP: 98/67  Pulse: 84  Weight: 257 lb 12.8 oz (116.9 kg)  Body mass index is 47.15 kg/m.           Physical Examination:   General appearance: alert, well appearing, and in no  distress  Mental status: alert, oriented to person, place, and time  Skin: warm & dry   Extremities: Edema: None    Cardiovascular: normal heart rate noted  Respiratory: normal respiratory effort, no distress  Abdomen: gravid, soft, non-tender  Pelvic: Cervical exam deferred         Fetal Status: Fetal Heart Rate (bpm): 151 Fundal Height: 32 cm Movement: Present    Fetal Surveillance Testing today: doppler   Chaperone: N/A  No results found for this or any previous visit (from the past 24 hours).  Assessment & Plan:  High-risk pregnancy: G2P1001 at [redacted]w[redacted]d with an Estimated Date of Delivery: 10/26/23   1) PG BMI 44, currently 47   Meds: No orders of the defined types were placed in this encounter.   Labs/procedures today: none  Treatment Plan:  U/S q4wks   2x/wk nst or weekly BPP @ 34wks   Deliver @ 39-40.6wks   Reviewed: Preterm labor symptoms and general obstetric precautions including but not limited to vaginal bleeding, contractions, leaking of fluid and fetal movement were reviewed in detail with the patient.  All questions were answered. Does have home bp cuff. Office bp cuff given: not applicable. Check bp weekly, let us know if consistently >140 and/or >90.  Follow-up: Return for As scheduled.   Future Appointments  Date  Time Provider Department Center  09/01/2023  8:30 AM [facility] - FT IMG 2 [facility] None  09/01/2023  9:30 AM Arabella Merles, CNM [facility] FTOBGYN  09/15/2023  8:30 AM [facility] - FTOBGYN [place] [facility] None  09/15/2023  9:50 AM Cheral Marker, CNM [facility]  09/22/2023  8:30 AM [facility] - [facility]YN [place] [facility] None  09/22/2023  9:50 AM Cheral Marker, CNM [facility]  09/29/2023  8:30 AM [facility] - FT IMG 2 [facility] None  09/29/2023  9:30 AM Cheral Marker, CNM [facility]  10/06/2023  9:15 AM [facility] - FT IMG 2 [facility] None  10/06/2023 10:10 AM Arabella Merles, CNM [facility]  10/13/2023  8:30 AM [facility] - FT IMG 2 [facility] None  10/13/2023  9:30 AM Cheral Marker, CNM [facility]  10/20/2023  8:30 AM [facility] - [facility]TO[facility]YN [place] [facility] None  10/20/2023  9:30 AM Cheral Marker, CNM [facility]    No orders of the defined types were placed in this encounter.  Cheral Marker CNM, [facility] 08/17/2023 12:18 PM

## 2023-08-18 ENCOUNTER — Encounter: Payer: Medicaid Other | Admitting: Advanced Practice Midwife

## 2023-09-01 ENCOUNTER — Encounter: Payer: Self-pay | Admitting: Women's Health

## 2023-09-01 ENCOUNTER — Ambulatory Visit (INDEPENDENT_AMBULATORY_CARE_PROVIDER_SITE_OTHER): Payer: Medicaid Other | Admitting: Radiology

## 2023-09-01 ENCOUNTER — Ambulatory Visit: Payer: Medicaid Other | Admitting: Women's Health

## 2023-09-01 VITALS — BP 97/63 | HR 105 | Wt 263.0 lb

## 2023-09-01 DIAGNOSIS — Z3A32 32 weeks gestation of pregnancy: Secondary | ICD-10-CM | POA: Diagnosis not present

## 2023-09-01 DIAGNOSIS — O0993 Supervision of high risk pregnancy, unspecified, third trimester: Secondary | ICD-10-CM

## 2023-09-01 DIAGNOSIS — O0992 Supervision of high risk pregnancy, unspecified, second trimester: Secondary | ICD-10-CM

## 2023-09-01 DIAGNOSIS — Z6841 Body Mass Index (BMI) 40.0 and over, adult: Secondary | ICD-10-CM

## 2023-09-01 NOTE — Progress Notes (Signed)
 HIGH-RISK PREGNANCY VISIT Patient name: Theresa Brady MRN [id number]  Date of birth: May 05, 1996 Chief Complaint:   Routine Prenatal Visit  History of Present Illness:   Theresa Brady is a 28 y.o. G22P1001 female at [redacted]w[redacted]d with an Estimated Date of Delivery: 10/26/23 being seen today for ongoing management of a high-risk pregnancy complicated by PG BMI 44.    Today she reports  tired/trouble sleeping more than 3hrs . Contractions: Not present. Vag. Bleeding: None.  Movement: Present. denies leaking of fluid.      04/19/2023    1:41 PM 03/31/2023   10:17 AM  Depression screen PHQ 2/9  Decreased Interest 0 0  Down, Depressed, Hopeless 0 0  PHQ - 2 Score 0 0  Altered sleeping 1 1  Tired, decreased energy 1 0  Change in appetite 0 0  Feeling bad or failure about yourself  0 0  Trouble concentrating 0 0  Moving slowly or fidgety/restless 0 0  Suicidal thoughts 0 0  PHQ-9 Score 2 1        04/19/2023    1:41 PM 03/31/2023   10:17 AM  GAD 7 : Generalized Anxiety Score  Nervous, Anxious, on Edge 1 1  Control/stop worrying 1 0  Worry too much - different things 1 0  Trouble relaxing 0 0  Restless 0 0  Easily annoyed or irritable 1 0  Afraid - awful might happen 1 0  Total GAD 7 Score 5 1     Review of Systems:   Pertinent items are noted in HPI Denies abnormal vaginal discharge w/ itching/odor/irritation, headaches, visual changes, shortness of breath, chest pain, abdominal pain, severe nausea/vomiting, or problems with urination or bowel movements unless otherwise stated above. Pertinent History Reviewed:  Reviewed past medical,surgical, social, obstetrical and family history.  Reviewed problem list, medications and allergies. Physical Assessment:   Vitals:   09/01/23 0930  BP: 97/63  Pulse: (!) 105  Weight: 263 lb (119.3 kg)  Body mass index is 48.1 kg/m.           Physical Examination:   General appearance: alert, well appearing, and in no distress  Mental  status: alert, oriented to person, place, and time  Skin: warm & dry   Extremities: Edema: None    Cardiovascular: normal heart rate noted  Respiratory: normal respiratory effort, no distress  Abdomen: gravid, soft, non-tender  Pelvic: Cervical exam deferred         Fetal Status:     Movement: Present    Fetal Surveillance Testing today:  [place]:  GA = 32+1 weeks Single active female fetus, cephalic, FHR = 152 bpm,  fundal right posterior pl, gr1 AFI = 15.6 cm,  57%, MVP = 5.2 cm,  EFW 51%, 1988g   Chaperone: N/A  No results found for this or any previous visit (from the past 24 hours).  Assessment & Plan:  High-risk pregnancy: G2P1001 at [redacted]w[redacted]d with an Estimated Date of Delivery: 10/26/23   1) PG BMI 44, currently 48  2) Tired/trouble sleeping, can try benadryl, tylenol pm or unisom prn  Meds: No orders of the defined types were placed in this encounter.   Labs/procedures today: U/S  Treatment Plan:  U/S q4wks   2x/wk nst or weekly BPP @ 34wks   Deliver @ 39-40.6wks   Reviewed: Preterm labor symptoms and general obstetric precautions including but not limited to vaginal bleeding, contractions, leaking of fluid and fetal movement were reviewed in detail with the patient.  All questions were answered. Does have home bp cuff. Office bp cuff given: not applicable. Check bp weekly, let us know if consistently >140 and/or >90.  Follow-up: Return for start weekly BPP/HROB w/ MD/CNM in 2wks (add EFW @ 36w).   Future Appointments  Date Time Provider Department Center  09/15/2023  8:30 AM [facility] - FTOBGYN [place] [facility] None  09/15/2023  9:50 AM Cheral Marker, CNM [facility] FTOBGYN  09/22/2023  8:30 AM [facility] - [facility]TO[facility]YN [place] [facility] None  09/22/2023  9:50 AM Cheral Marker, CNM [facility]TO[facility]  09/29/2023  9:15 AM [facility] - [facility]TO[facility]YN [place] [facility] None  09/29/2023 10:10 AM Arabella Merles, CNM [facility]TO[facility]  10/06/2023  9:15 AM [facility] - FT IMG 2 [facility] None  10/06/2023 10:10 AM Arabella Merles, CNM [facility]  FTOBGYN  10/13/2023  8:30 AM [facility] - FT IMG 2 [facility] None  10/13/2023  9:30 AM Cheral Marker, CNM [facility]TO[facility]YN  10/20/2023  8:30 AM [facility] - [facility]YN [place] [facility] None  10/20/2023  9:30 AM Cheral Marker, CNM [facility]    Orders Placed This Encounter  Procedures   US FETAL BPP WO NON STRESS   Cheral Marker CNM, [facility] 09/01/2023 9:56 AM

## 2023-09-01 NOTE — Progress Notes (Signed)
 [place]:  GA = 32+1 weeks Single active female fetus, cephalic, FHR = 152 bpm,  fundal right posterior pl, gr1 AFI = 15.6 cm,  57%, MVP = 5.2 cm,  EFW 51%, 1988g

## 2023-09-01 NOTE — Patient Instructions (Signed)
Theresa Brady, thank you for choosing our office today! We appreciate the opportunity to meet your healthcare needs. You may receive a short survey by mail, e-mail, or through MyChart. If you are happy with your care we would appreciate if you could take just a few minutes to complete the survey questions. We read all of your comments and take your feedback very seriously. Thank you again for choosing our office.  [facility]  [facility] ([address] [place], [place] [address]) Entrance C, located off of [address] Free 24/7 valet parking   CLASSES: Go to [email or web address] to register for classes (childbirth, breastfeeding, waterbirth, infant CPR, daddy bootcamp, etc.)  Call the office ([phone]) or go to [facility] if: You begin to have strong, frequent contractions Your water breaks.  Sometimes it is a big gush of fluid, sometimes it is just a trickle that keeps getting your panties wet or running down your legs You have vaginal bleeding.  It is normal to have a small amount of spotting if your cervix was checked.  You don't feel your baby moving like normal.  If you don't, get you something to eat and drink and lay down and focus on feeling your baby move.   If your baby is still not moving like normal, you should call the office or go to [facility].  Call the office ([phone]) or [facility] for these signs of pre-eclampsia: Severe headache that does not go away with Tylenol Visual changes- seeing spots, double, blurred vision Pain under your right breast or upper abdomen that does not go away with Tums or heartburn medicine Nausea and/or vomiting Severe swelling in your hands, feet, and face   Tdap Vaccine It is recommended that you get the Tdap vaccine during the third trimester of EACH pregnancy to help protect your baby from getting pertussis (whooping cough) 27-36 weeks is the BEST time to do  this so that you can pass the protection on to your baby. During pregnancy is better than after pregnancy, but if you are unable to get it during pregnancy it will be offered at the hospital.  You can get this vaccine with us, at the health department, your family doctor, or some local pharmacies Everyone who will be around your baby should also be up-to-date on their vaccines before the baby comes. Adults (who are not pregnant) only need 1 dose of Tdap during adulthood.   [place] Pediatricians/Family Doctors [facility] ([facility]): [address], [phone]           [facility]: [address], [phone]                [facility] ([facility]): [address], [phone] (call to ask if accepting patients) [facility]: [address], [place], [phone]    [place] Pediatricians/Family Doctors [facility] ([facility]): [address], [phone] [facility]: [address], [phone] [facility]: [address], [phone]  [place] [facility]  [facility] ([facility]): [phone] [facility]: [address], [phone]   [place] [facility] Doctors [facility]: [address], [phone]  [place] [facility]  [facility]: [address], [phone]  Home Blood Pressure Monitoring for Patients   Your provider has recommended that you check your   blood pressure (BP) at least once a week at home. If you do not have a blood pressure cuff at home, one will be provided for you. Contact your provider if you have not received your monitor within 1 week.  ° °Helpful Tips for Accurate Home Blood Pressure Checks  °Don't smoke, exercise, or drink caffeine 30 minutes before checking your BP °Use the restroom before checking your BP (a full bladder can raise your  pressure) °Relax in a comfortable upright chair °Feet on the ground °Left arm resting comfortably on a flat surface at the level of your heart °Legs uncrossed °Back supported °Sit quietly and don't talk °Place the cuff on your bare arm °Adjust snuggly, so that only two fingertips can fit between your skin and the top of the cuff °Check 2 readings separated by at least one minute °Keep a log of your BP readings °For a visual, please reference this diagram: http://ccnc.care/bpdiagram ° °Provider Name: [facility]     Phone: [phone] ° °Zone 1: ALL CLEAR  °Continue to monitor your symptoms:  °BP reading is less than 140 (top number) or less than 90 (bottom number)  °No right upper stomach pain °No headaches or seeing spots °No feeling nauseated or throwing up °No swelling in face and hands ° °Zone 2: CAUTION °Call your doctor's office for any of the following:  °BP reading is greater than 140 (top number) or greater than 90 (bottom number)  °Stomach pain under your ribs in the middle or right side °Headaches or seeing spots °Feeling nauseated or throwing up °Swelling in face and hands ° °Zone 3: EMERGENCY  °Seek immediate medical care if you have any of the following:  °BP reading is greater than160 (top number) or greater than 110 (bottom number) °Severe headaches not improving with Tylenol °Serious difficulty catching your breath °Any worsening symptoms from Zone 2  °Preterm Labor and Birth Information ° °The normal length of a pregnancy is 39-41 weeks. Preterm labor is when labor starts before 37 completed weeks of pregnancy. °What are the risk factors for preterm labor? °Preterm labor is more likely to occur in women who: °Have certain infections during pregnancy such as a bladder infection, sexually transmitted infection, or infection inside the uterus (chorioamnionitis). °Have a shorter-than-normal cervix. °Have gone into preterm labor before. °Have had surgery on their cervix. °Are younger than age 17  or older than age 35. °Are African American. °Are pregnant with twins or multiple babies (multiple gestation). °Take street drugs or smoke while pregnant. °Do not gain enough weight while pregnant. °Became pregnant shortly after having been pregnant. °What are the symptoms of preterm labor? °Symptoms of preterm labor include: °Cramps similar to those that can happen during a menstrual period. The cramps may happen with diarrhea. °Pain in the abdomen or lower back. °Regular uterine contractions that may feel like tightening of the abdomen. °A feeling of increased pressure in the pelvis. °Increased watery or bloody mucus discharge from the vagina. °Water breaking (ruptured amniotic sac). °Why is it important to recognize signs of preterm labor? °It is important to recognize signs of preterm labor because babies who are born prematurely may not be fully developed. This can put them at an increased risk for: °Long-term (chronic) heart and lung problems. °Difficulty immediately after birth with regulating body systems, including blood sugar, body temperature, heart rate, and breathing rate. °Bleeding in the brain. °Cerebral palsy. °Learning difficulties. °Death. °These risks are highest for babies who are born before 34 weeks   of pregnancy. How is preterm labor treated? Treatment depends on the length of your pregnancy, your condition, and the health of your baby. It may involve: Having a stitch (suture) placed in your cervix to prevent your cervix from opening too early (cerclage). Taking or being given medicines, such as: Hormone medicines. These may be given early in pregnancy to help support the pregnancy. Medicine to stop contractions. Medicines to help mature the babys lungs. These may be prescribed if the risk of delivery is high. Medicines to prevent your baby from developing cerebral palsy. If the labor happens before 34 weeks of pregnancy, you may need to stay in the hospital. What should I do if I  think I am in preterm labor? If you think that you are going into preterm labor, call your health care provider right away. How can I prevent preterm labor in future pregnancies? To increase your chance of having a full-term pregnancy: Do not use any tobacco products, such as cigarettes, chewing tobacco, and e-cigarettes. If you need help quitting, ask your health care provider. Do not use street drugs or medicines that have not been prescribed to you during your pregnancy. Talk with your health care provider before taking any herbal supplements, even if you have been taking them regularly. Make sure you gain a healthy amount of weight during your pregnancy. Watch for infection. If you think that you might have an infection, get it checked right away. Make sure to tell your health care provider if you have gone into preterm labor before. This information is not intended to replace advice given to you by your health care provider. Make sure you discuss any questions you have with your health care provider. Document Revised: 09/16/2018 Document Reviewed: 10/16/2015 Elsevier Patient Education  [place].

## 2023-09-15 ENCOUNTER — Encounter: Payer: Self-pay | Admitting: Women's Health

## 2023-09-15 ENCOUNTER — Other Ambulatory Visit: Payer: Self-pay | Admitting: Women's Health

## 2023-09-15 ENCOUNTER — Ambulatory Visit: Payer: Medicaid Other | Admitting: Radiology

## 2023-09-15 ENCOUNTER — Ambulatory Visit: Payer: Medicaid Other | Admitting: Women's Health

## 2023-09-15 VITALS — BP 100/69 | HR 99 | Wt 266.0 lb

## 2023-09-15 DIAGNOSIS — Z6841 Body Mass Index (BMI) 40.0 and over, adult: Secondary | ICD-10-CM | POA: Diagnosis not present

## 2023-09-15 DIAGNOSIS — Z3A34 34 weeks gestation of pregnancy: Secondary | ICD-10-CM

## 2023-09-15 DIAGNOSIS — O0990 Supervision of high risk pregnancy, unspecified, unspecified trimester: Secondary | ICD-10-CM

## 2023-09-15 DIAGNOSIS — O0993 Supervision of high risk pregnancy, unspecified, third trimester: Secondary | ICD-10-CM

## 2023-09-15 NOTE — Patient Instructions (Signed)
Theresa Brady, thank you for choosing our office today! We appreciate the opportunity to meet your healthcare needs. You may receive a short survey by mail, e-mail, or through MyChart. If you are happy with your care we would appreciate if you could take just a few minutes to complete the survey questions. We read all of your comments and take your feedback very seriously. Thank you again for choosing our office.  [facility]  [facility] ([address] [place], [place] [address]) Entrance C, located off of [address] Free 24/7 valet parking   CLASSES: Go to [email or web address] to register for classes (childbirth, breastfeeding, waterbirth, infant CPR, daddy bootcamp, etc.)  Call the office ([phone]) or go to [facility] if: You begin to have strong, frequent contractions Your water breaks.  Sometimes it is a big gush of fluid, sometimes it is just a trickle that keeps getting your panties wet or running down your legs You have vaginal bleeding.  It is normal to have a small amount of spotting if your cervix was checked.  You don't feel your baby moving like normal.  If you don't, get you something to eat and drink and lay down and focus on feeling your baby move.   If your baby is still not moving like normal, you should call the office or go to [facility].  Call the office ([phone]) or [facility] for these signs of pre-eclampsia: Severe headache that does not go away with Tylenol Visual changes- seeing spots, double, blurred vision Pain under your right breast or upper abdomen that does not go away with Tums or heartburn medicine Nausea and/or vomiting Severe swelling in your hands, feet, and face   Tdap Vaccine It is recommended that you get the Tdap vaccine during the third trimester of EACH pregnancy to help protect your baby from getting pertussis (whooping cough) 27-36 weeks is the BEST time to do  this so that you can pass the protection on to your baby. During pregnancy is better than after pregnancy, but if you are unable to get it during pregnancy it will be offered at the hospital.  You can get this vaccine with us, at the health department, your family doctor, or some local pharmacies Everyone who will be around your baby should also be up-to-date on their vaccines before the baby comes. Adults (who are not pregnant) only need 1 dose of Tdap during adulthood.   [place] Pediatricians/Family Doctors [facility] ([facility]): [address], [phone]           [facility]: [address], [phone]                [facility] ([facility]): [address], [phone] (call to ask if accepting patients) [facility]: [address], [place], [phone]    [place] Pediatricians/Family Doctors [facility] ([facility]): [address], [phone] [facility]: [address], [phone] [facility]: [address], [phone]  [place] [facility]  [facility] ([facility]): [phone] [facility]: [address], [phone]   [place] [facility] Doctors [facility]: [address], [phone]  [place] [facility]  [facility]: [address], [phone]  Home Blood Pressure Monitoring for Patients   Your provider has recommended that you check your   blood pressure (BP) at least once a week at home. If you do not have a blood pressure cuff at home, one will be provided for you. Contact your provider if you have not received your monitor within 1 week.  ° °Helpful Tips for Accurate Home Blood Pressure Checks  °Don't smoke, exercise, or drink caffeine 30 minutes before checking your BP °Use the restroom before checking your BP (a full bladder can raise your  pressure) °Relax in a comfortable upright chair °Feet on the ground °Left arm resting comfortably on a flat surface at the level of your heart °Legs uncrossed °Back supported °Sit quietly and don't talk °Place the cuff on your bare arm °Adjust snuggly, so that only two fingertips can fit between your skin and the top of the cuff °Check 2 readings separated by at least one minute °Keep a log of your BP readings °For a visual, please reference this diagram: http://ccnc.care/bpdiagram ° °Provider Name: [facility]     Phone: [phone] ° °Zone 1: ALL CLEAR  °Continue to monitor your symptoms:  °BP reading is less than 140 (top number) or less than 90 (bottom number)  °No right upper stomach pain °No headaches or seeing spots °No feeling nauseated or throwing up °No swelling in face and hands ° °Zone 2: CAUTION °Call your doctor's office for any of the following:  °BP reading is greater than 140 (top number) or greater than 90 (bottom number)  °Stomach pain under your ribs in the middle or right side °Headaches or seeing spots °Feeling nauseated or throwing up °Swelling in face and hands ° °Zone 3: EMERGENCY  °Seek immediate medical care if you have any of the following:  °BP reading is greater than160 (top number) or greater than 110 (bottom number) °Severe headaches not improving with Tylenol °Serious difficulty catching your breath °Any worsening symptoms from Zone 2  °Preterm Labor and Birth Information ° °The normal length of a pregnancy is 39-41 weeks. Preterm labor is when labor starts before 37 completed weeks of pregnancy. °What are the risk factors for preterm labor? °Preterm labor is more likely to occur in women who: °Have certain infections during pregnancy such as a bladder infection, sexually transmitted infection, or infection inside the uterus (chorioamnionitis). °Have a shorter-than-normal cervix. °Have gone into preterm labor before. °Have had surgery on their cervix. °Are younger than age 17  or older than age 35. °Are African American. °Are pregnant with twins or multiple babies (multiple gestation). °Take street drugs or smoke while pregnant. °Do not gain enough weight while pregnant. °Became pregnant shortly after having been pregnant. °What are the symptoms of preterm labor? °Symptoms of preterm labor include: °Cramps similar to those that can happen during a menstrual period. The cramps may happen with diarrhea. °Pain in the abdomen or lower back. °Regular uterine contractions that may feel like tightening of the abdomen. °A feeling of increased pressure in the pelvis. °Increased watery or bloody mucus discharge from the vagina. °Water breaking (ruptured amniotic sac). °Why is it important to recognize signs of preterm labor? °It is important to recognize signs of preterm labor because babies who are born prematurely may not be fully developed. This can put them at an increased risk for: °Long-term (chronic) heart and lung problems. °Difficulty immediately after birth with regulating body systems, including blood sugar, body temperature, heart rate, and breathing rate. °Bleeding in the brain. °Cerebral palsy. °Learning difficulties. °Death. °These risks are highest for babies who are born before 34 weeks   of pregnancy. How is preterm labor treated? Treatment depends on the length of your pregnancy, your condition, and the health of your baby. It may involve: Having a stitch (suture) placed in your cervix to prevent your cervix from opening too early (cerclage). Taking or being given medicines, such as: Hormone medicines. These may be given early in pregnancy to help support the pregnancy. Medicine to stop contractions. Medicines to help mature the babys lungs. These may be prescribed if the risk of delivery is high. Medicines to prevent your baby from developing cerebral palsy. If the labor happens before 34 weeks of pregnancy, you may need to stay in the hospital. What should I do if I  think I am in preterm labor? If you think that you are going into preterm labor, call your health care provider right away. How can I prevent preterm labor in future pregnancies? To increase your chance of having a full-term pregnancy: Do not use any tobacco products, such as cigarettes, chewing tobacco, and e-cigarettes. If you need help quitting, ask your health care provider. Do not use street drugs or medicines that have not been prescribed to you during your pregnancy. Talk with your health care provider before taking any herbal supplements, even if you have been taking them regularly. Make sure you gain a healthy amount of weight during your pregnancy. Watch for infection. If you think that you might have an infection, get it checked right away. Make sure to tell your health care provider if you have gone into preterm labor before. This information is not intended to replace advice given to you by your health care provider. Make sure you discuss any questions you have with your health care provider. Document Revised: 09/16/2018 Document Reviewed: 10/16/2015 Elsevier Patient Education  [place].

## 2023-09-15 NOTE — Progress Notes (Signed)
 [place]: GA = 34+1 weeks Single active female fetus, cephalic, FHR = 149 bpm, posterior pl, gr1 AFI = 14.7 cm, 53%, MVP = 6.5 cm,   BPP = 6/8  (respirations seen briefly but not sustatined x 30 seconds) UAD were WNL, RI: 0.61, 51%

## 2023-09-15 NOTE — Progress Notes (Signed)
 HIGH-RISK PREGNANCY VISIT Patient name: Theresa Brady MRN [id number]  Date of birth: Feb 05, 1996 Chief Complaint:   High Risk Gestation ([place] today!! NST today!!!)  History of Present Illness:   Theresa Brady is a 28 y.o. G83P1001 female at [redacted]w[redacted]d with an Estimated Date of Delivery: 10/26/23 being seen today for ongoing management of a high-risk pregnancy complicated by PG BMI 44.    Today she reports no complaints. Contractions: Not present. Vag. Bleeding: None.  Movement: Present. denies leaking of fluid.      04/19/2023    1:41 PM 03/31/2023   10:17 AM  Depression screen PHQ 2/9  Decreased Interest 0 0  Down, Depressed, Hopeless 0 0  PHQ - 2 Score 0 0  Altered sleeping 1 1  Tired, decreased energy 1 0  Change in appetite 0 0  Feeling bad or failure about yourself  0 0  Trouble concentrating 0 0  Moving slowly or fidgety/restless 0 0  Suicidal thoughts 0 0  PHQ-9 Score 2 1        04/19/2023    1:41 PM 03/31/2023   10:17 AM  GAD 7 : Generalized Anxiety Score  Nervous, Anxious, on Edge 1 1  Control/stop worrying 1 0  Worry too much - different things 1 0  Trouble relaxing 0 0  Restless 0 0  Easily annoyed or irritable 1 0  Afraid - awful might happen 1 0  Total GAD 7 Score 5 1     Review of Systems:   Pertinent items are noted in HPI Denies abnormal vaginal discharge w/ itching/odor/irritation, headaches, visual changes, shortness of breath, chest pain, abdominal pain, severe nausea/vomiting, or problems with urination or bowel movements unless otherwise stated above. Pertinent History Reviewed:  Reviewed past medical,surgical, social, obstetrical and family history.  Reviewed problem list, medications and allergies. Physical Assessment:   Vitals:   09/15/23 0915  BP: 100/69  Pulse: 99  Weight: 266 lb (120.7 kg)  Body mass index is 48.65 kg/m.           Physical Examination:   General appearance: alert, well appearing, and in no distress  Mental status:  alert, oriented to person, place, and time  Skin: warm & dry   Extremities: Edema: None    Cardiovascular: normal heart rate noted  Respiratory: normal respiratory effort, no distress  Abdomen: gravid, soft, non-tender  Pelvic: Cervical exam deferred         Fetal Status:     Movement: Present    Fetal Surveillance Testing today:  [place]: GA = 34+1 weeks Single active female fetus, cephalic, FHR = 149 bpm, posterior pl, gr1 AFI = 14.7 cm, 53%, MVP = 6.5 cm,   BPP = 6/8  (respirations seen briefly but not sustatined x 30 seconds) UAD were WNL, RI: 0.61, 51%  NST (for BPP 6/8): FHR baseline 135 bpm, Variability: moderate, Accelerations:present, Decelerations:  Absent= Cat 1/reactive Toco: none   Chaperone: N/A  No results found for this or any previous visit (from the past 24 hours).  Assessment & Plan:  High-risk pregnancy: G2P1001 at [redacted]w[redacted]d with an Estimated Date of Delivery: 10/26/23   1) PG BMI 44, currently 48  2) BPP 6/8, NST reactive for total of 8/10  Meds: No orders of the defined types were placed in this encounter.   Labs/procedures today: NST and U/S  Treatment Plan:  U/S q4wks   2x/wk nst or weekly BPP @ 34wks   Deliver @ 39-40wks   Reviewed:  Preterm labor symptoms and general obstetric precautions including but not limited to vaginal bleeding, contractions, leaking of fluid and fetal movement were reviewed in detail with the patient.  All questions were answered. Does have home bp cuff. Office bp cuff given: not applicable. Check bp weekly, let us know if consistently >140 and/or >90.  Follow-up: Return for As scheduled.   Future Appointments  Date Time Provider Department Center  09/22/2023  8:30 AM [facility] - FT IMG 2 [facility] None  09/22/2023  9:50 AM Cheral Marker, CNM [facility] FTOBGYN  09/29/2023  9:15 AM [facility] - [facility]TO[facility]YN [place] [facility] None  09/29/2023 10:10 AM Arabella Merles, CNM [facility]TO[facility]  10/06/2023  9:15 AM [facility] - FT IMG 2 [facility] None  10/06/2023 10:10 AM  Arabella Merles, CNM [facility]TO[facility]  10/13/2023  8:30 AM [facility] - FT IMG 2 [facility] None  10/13/2023  9:30 AM Cheral Marker, CNM [facility]TO[facility]YN  10/20/2023  8:30 AM [facility] - [facility]YN [place] [facility] None  10/20/2023  9:30 AM Cheral Marker, CNM [facility]TO[facility]    No orders of the defined types were placed in this encounter.  Cheral Marker CNM, [facility] 09/15/2023 10:56 AM

## 2023-09-18 DIAGNOSIS — Z419 Encounter for procedure for purposes other than remedying health state, unspecified: Secondary | ICD-10-CM | POA: Diagnosis not present

## 2023-09-20 ENCOUNTER — Other Ambulatory Visit: Payer: Self-pay | Admitting: Women's Health

## 2023-09-20 DIAGNOSIS — O0993 Supervision of high risk pregnancy, unspecified, third trimester: Secondary | ICD-10-CM

## 2023-09-20 DIAGNOSIS — Z6841 Body Mass Index (BMI) 40.0 and over, adult: Secondary | ICD-10-CM

## 2023-09-22 ENCOUNTER — Ambulatory Visit: Payer: Medicaid Other | Admitting: Women's Health

## 2023-09-22 ENCOUNTER — Encounter: Payer: Self-pay | Admitting: Women's Health

## 2023-09-22 ENCOUNTER — Ambulatory Visit: Payer: Medicaid Other | Admitting: Radiology

## 2023-09-22 VITALS — BP 108/75 | HR 97 | Wt 266.0 lb

## 2023-09-22 DIAGNOSIS — O0993 Supervision of high risk pregnancy, unspecified, third trimester: Secondary | ICD-10-CM

## 2023-09-22 DIAGNOSIS — Z3A35 35 weeks gestation of pregnancy: Secondary | ICD-10-CM

## 2023-09-22 DIAGNOSIS — Z6841 Body Mass Index (BMI) 40.0 and over, adult: Secondary | ICD-10-CM | POA: Diagnosis not present

## 2023-09-22 NOTE — Patient Instructions (Signed)
Theresa Brady, thank you for choosing our office today! We appreciate the opportunity to meet your healthcare needs. You may receive a short survey by mail, e-mail, or through MyChart. If you are happy with your care we would appreciate if you could take just a few minutes to complete the survey questions. We read all of your comments and take your feedback very seriously. Thank you again for choosing our office.  [facility]  [facility] ([address] [place], [place] [address]) Entrance C, located off of [address] Free 24/7 valet parking   CLASSES: Go to [email or web address] to register for classes (childbirth, breastfeeding, waterbirth, infant CPR, daddy bootcamp, etc.)  Call the office ([phone]) or go to [facility] if: You begin to have strong, frequent contractions Your water breaks.  Sometimes it is a big gush of fluid, sometimes it is just a trickle that keeps getting your panties wet or running down your legs You have vaginal bleeding.  It is normal to have a small amount of spotting if your cervix was checked.  You don't feel your baby moving like normal.  If you don't, get you something to eat and drink and lay down and focus on feeling your baby move.   If your baby is still not moving like normal, you should call the office or go to [facility].  Call the office ([phone]) or [facility] for these signs of pre-eclampsia: Severe headache that does not go away with Tylenol Visual changes- seeing spots, double, blurred vision Pain under your right breast or upper abdomen that does not go away with Tums or heartburn medicine Nausea and/or vomiting Severe swelling in your hands, feet, and face   Tdap Vaccine It is recommended that you get the Tdap vaccine during the third trimester of EACH pregnancy to help protect your baby from getting pertussis (whooping cough) 27-36 weeks is the BEST time to do  this so that you can pass the protection on to your baby. During pregnancy is better than after pregnancy, but if you are unable to get it during pregnancy it will be offered at the hospital.  You can get this vaccine with us, at the health department, your family doctor, or some local pharmacies Everyone who will be around your baby should also be up-to-date on their vaccines before the baby comes. Adults (who are not pregnant) only need 1 dose of Tdap during adulthood.   [place] Pediatricians/Family Doctors [facility] ([facility]): [address], [phone]           [facility]: [address], [phone]                [facility] ([facility]): [address], [phone] (call to ask if accepting patients) [facility]: [address], [place], [phone]    [place] Pediatricians/Family Doctors [facility] ([facility]): [address], [phone] [facility]: [address], [phone] [facility]: [address], [phone]  [place] [facility]  [facility] ([facility]): [phone] [facility]: [address], [phone]   [place] [facility] Doctors [facility]: [address], [phone]  [place] [facility]  [facility]: [address], [phone]  Home Blood Pressure Monitoring for Patients   Your provider has recommended that you check your   blood pressure (BP) at least once a week at home. If you do not have a blood pressure cuff at home, one will be provided for you. Contact your provider if you have not received your monitor within 1 week.  ° °Helpful Tips for Accurate Home Blood Pressure Checks  °Don't smoke, exercise, or drink caffeine 30 minutes before checking your BP °Use the restroom before checking your BP (a full bladder can raise your  pressure) °Relax in a comfortable upright chair °Feet on the ground °Left arm resting comfortably on a flat surface at the level of your heart °Legs uncrossed °Back supported °Sit quietly and don't talk °Place the cuff on your bare arm °Adjust snuggly, so that only two fingertips can fit between your skin and the top of the cuff °Check 2 readings separated by at least one minute °Keep a log of your BP readings °For a visual, please reference this diagram: http://ccnc.care/bpdiagram ° °Provider Name: [facility]     Phone: [phone] ° °Zone 1: ALL CLEAR  °Continue to monitor your symptoms:  °BP reading is less than 140 (top number) or less than 90 (bottom number)  °No right upper stomach pain °No headaches or seeing spots °No feeling nauseated or throwing up °No swelling in face and hands ° °Zone 2: CAUTION °Call your doctor's office for any of the following:  °BP reading is greater than 140 (top number) or greater than 90 (bottom number)  °Stomach pain under your ribs in the middle or right side °Headaches or seeing spots °Feeling nauseated or throwing up °Swelling in face and hands ° °Zone 3: EMERGENCY  °Seek immediate medical care if you have any of the following:  °BP reading is greater than160 (top number) or greater than 110 (bottom number) °Severe headaches not improving with Tylenol °Serious difficulty catching your breath °Any worsening symptoms from Zone 2  °Preterm Labor and Birth Information ° °The normal length of a pregnancy is 39-41 weeks. Preterm labor is when labor starts before 37 completed weeks of pregnancy. °What are the risk factors for preterm labor? °Preterm labor is more likely to occur in women who: °Have certain infections during pregnancy such as a bladder infection, sexually transmitted infection, or infection inside the uterus (chorioamnionitis). °Have a shorter-than-normal cervix. °Have gone into preterm labor before. °Have had surgery on their cervix. °Are younger than age 17  or older than age 35. °Are African American. °Are pregnant with twins or multiple babies (multiple gestation). °Take street drugs or smoke while pregnant. °Do not gain enough weight while pregnant. °Became pregnant shortly after having been pregnant. °What are the symptoms of preterm labor? °Symptoms of preterm labor include: °Cramps similar to those that can happen during a menstrual period. The cramps may happen with diarrhea. °Pain in the abdomen or lower back. °Regular uterine contractions that may feel like tightening of the abdomen. °A feeling of increased pressure in the pelvis. °Increased watery or bloody mucus discharge from the vagina. °Water breaking (ruptured amniotic sac). °Why is it important to recognize signs of preterm labor? °It is important to recognize signs of preterm labor because babies who are born prematurely may not be fully developed. This can put them at an increased risk for: °Long-term (chronic) heart and lung problems. °Difficulty immediately after birth with regulating body systems, including blood sugar, body temperature, heart rate, and breathing rate. °Bleeding in the brain. °Cerebral palsy. °Learning difficulties. °Death. °These risks are highest for babies who are born before 34 weeks   of pregnancy. How is preterm labor treated? Treatment depends on the length of your pregnancy, your condition, and the health of your baby. It may involve: Having a stitch (suture) placed in your cervix to prevent your cervix from opening too early (cerclage). Taking or being given medicines, such as: Hormone medicines. These may be given early in pregnancy to help support the pregnancy. Medicine to stop contractions. Medicines to help mature the babys lungs. These may be prescribed if the risk of delivery is high. Medicines to prevent your baby from developing cerebral palsy. If the labor happens before 34 weeks of pregnancy, you may need to stay in the hospital. What should I do if I  think I am in preterm labor? If you think that you are going into preterm labor, call your health care provider right away. How can I prevent preterm labor in future pregnancies? To increase your chance of having a full-term pregnancy: Do not use any tobacco products, such as cigarettes, chewing tobacco, and e-cigarettes. If you need help quitting, ask your health care provider. Do not use street drugs or medicines that have not been prescribed to you during your pregnancy. Talk with your health care provider before taking any herbal supplements, even if you have been taking them regularly. Make sure you gain a healthy amount of weight during your pregnancy. Watch for infection. If you think that you might have an infection, get it checked right away. Make sure to tell your health care provider if you have gone into preterm labor before. This information is not intended to replace advice given to you by your health care provider. Make sure you discuss any questions you have with your health care provider. Document Revised: 09/16/2018 Document Reviewed: 10/16/2015 Elsevier Patient Education  [place].

## 2023-09-22 NOTE — Progress Notes (Signed)
 [place] : GA = 35+1 weeks Single active female fetus, cephalic, FHR = 157 bpm, AFI = 15 cm 56% MVP = 5.9 cm  BPP = 8/8

## 2023-09-22 NOTE — Progress Notes (Signed)
 HIGH-RISK PREGNANCY VISIT Patient name: Theresa Brady MRN [id number]  Date of birth: Nov 26, 1995 Chief Complaint:   Routine Prenatal Visit  History of Present Illness:   Theresa Brady is a 28 y.o. G49P1001 female at [redacted]w[redacted]d with an Estimated Date of Delivery: 10/26/23 being seen today for ongoing management of a high-risk pregnancy complicated by PG BMI 44.    Today she reports Lt round ligament pain/Lt back pain. Contractions: Not present. Vag. Bleeding: None.  Movement: Present. denies leaking of fluid.      04/19/2023    1:41 PM 03/31/2023   10:17 AM  Depression screen PHQ 2/9  Decreased Interest 0 0  Down, Depressed, Hopeless 0 0  PHQ - 2 Score 0 0  Altered sleeping 1 1  Tired, decreased energy 1 0  Change in appetite 0 0  Feeling bad or failure about yourself  0 0  Trouble concentrating 0 0  Moving slowly or fidgety/restless 0 0  Suicidal thoughts 0 0  PHQ-9 Score 2 1        04/19/2023    1:41 PM 03/31/2023   10:17 AM  GAD 7 : Generalized Anxiety Score  Nervous, Anxious, on Edge 1 1  Control/stop worrying 1 0  Worry too much - different things 1 0  Trouble relaxing 0 0  Restless 0 0  Easily annoyed or irritable 1 0  Afraid - awful might happen 1 0  Total GAD 7 Score 5 1     Review of Systems:   Pertinent items are noted in HPI Denies abnormal vaginal discharge w/ itching/odor/irritation, headaches, visual changes, shortness of breath, chest pain, abdominal pain, severe nausea/vomiting, or problems with urination or bowel movements unless otherwise stated above. Pertinent History Reviewed:  Reviewed past medical,surgical, social, obstetrical and family history.  Reviewed problem list, medications and allergies. Physical Assessment:   Vitals:   09/22/23 0859  BP: 108/75  Pulse: 97  Weight: 266 lb (120.7 kg)  Body mass index is 48.65 kg/m.           Physical Examination:   General appearance: alert, well appearing, and in no distress  Mental status:  alert, oriented to person, place, and time  Skin: warm & dry   Extremities:      Cardiovascular: normal heart rate noted  Respiratory: normal respiratory effort, no distress  Abdomen: gravid, soft, non-tender  Pelvic: Cervical exam deferred         Fetal Status:     Movement: Present    Fetal Surveillance Testing today: [place] : GA = 35+1 weeks Single active female fetus, cephalic, FHR = 157 bpm, AFI = 15 cm 56% MVP = 5.9 cm  BPP = 8/8  good respirations and movements  Chaperone: N/A  No results found for this or any previous visit (from the past 24 hours).  Assessment & Plan:  High-risk pregnancy: G2P1001 at [redacted]w[redacted]d with an Estimated Date of Delivery: 10/26/23   1) PGBMI 44, currently 48  2) Lt RLP/back pain, discussed relief measures  Meds: No orders of the defined types were placed in this encounter.   Labs/procedures today: U/S  Treatment Plan:  U/S q4wks   2x/wk nst or weekly BPP  Deliver @ 39-40.6wks   Reviewed: Preterm labor symptoms and general obstetric precautions including but not limited to vaginal bleeding, contractions, leaking of fluid and fetal movement were reviewed in detail with the patient.  All questions were answered. Does have home bp cuff. Office bp cuff given: not applicable.  Check bp weekly, let us  know if consistently >140 and/or >90.  Follow-up: Return for As scheduled.   Future Appointments  Date Time Provider Department Center  09/29/2023  9:15 AM [facility] - FTOBGYN US  [facility] None  09/29/2023 10:10 AM Jolayne Natter, CNM [facility] FTOBGYN  10/06/2023  9:15 AM [facility] - FT IMG 2 [facility] None  10/06/2023 10:10 AM Jolayne Natter, CNM [facility]TO[facility]YN  10/13/2023  8:30 AM [facility] - FT IMG 2 [facility] None  10/13/2023  9:30 AM Ferd Householder, CNM [facility] FTO[facility]YN  10/20/2023  8:30 AM [facility] - FTO[facility]YN US  [facility]G None  10/20/2023  9:30 AM Ferd Householder, CNM [facility]TO[facility]YN    No orders of the defined types were placed in this encounter.  Ferd Householder CNM,  [facility] 09/22/2023 9:56 AM

## 2023-09-29 ENCOUNTER — Ambulatory Visit: Payer: Medicaid Other | Admitting: Advanced Practice Midwife

## 2023-09-29 ENCOUNTER — Encounter: Payer: Self-pay | Admitting: Advanced Practice Midwife

## 2023-09-29 ENCOUNTER — Ambulatory Visit: Payer: Medicaid Other

## 2023-09-29 ENCOUNTER — Other Ambulatory Visit (HOSPITAL_COMMUNITY)
Admission: RE | Admit: 2023-09-29 | Discharge: 2023-09-29 | Disposition: A | Discharge status: Home / Self Care | Source: Ambulatory Visit | Attending: Women's Health | Admitting: Women's Health

## 2023-09-29 VITALS — BP 99/69 | HR 84 | Wt 271.4 lb

## 2023-09-29 DIAGNOSIS — Z3493 Encounter for supervision of normal pregnancy, unspecified, third trimester: Secondary | ICD-10-CM | POA: Insufficient documentation

## 2023-09-29 DIAGNOSIS — Z3A36 36 weeks gestation of pregnancy: Secondary | ICD-10-CM | POA: Diagnosis not present

## 2023-09-29 DIAGNOSIS — O0990 Supervision of high risk pregnancy, unspecified, unspecified trimester: Secondary | ICD-10-CM

## 2023-09-29 DIAGNOSIS — O0992 Supervision of high risk pregnancy, unspecified, second trimester: Secondary | ICD-10-CM

## 2023-09-29 DIAGNOSIS — Z6841 Body Mass Index (BMI) 40.0 and over, adult: Secondary | ICD-10-CM | POA: Diagnosis not present

## 2023-09-29 DIAGNOSIS — O0993 Supervision of high risk pregnancy, unspecified, third trimester: Secondary | ICD-10-CM | POA: Diagnosis not present

## 2023-09-29 NOTE — Progress Notes (Signed)
 HIGH-RISK PREGNANCY VISIT Patient name: Theresa Brady MRN [id number]  Date of birth: [birth year] Chief Complaint:   Routine Prenatal Visit (culture) and Pregnancy Ultrasound  History of Present Illness:   Theresa Brady is a 28 y.o. G93P1001 female at [redacted]w[redacted]d with an Estimated Date of Delivery: 10/26/23 being seen today for ongoing management of a high-risk pregnancy complicated by PG BMI 44.    Today she reports no complaints. Contractions: Not present.  .  Movement: Present. denies leaking of fluid.      04/19/2023    1:41 PM 03/31/2023   10:17 AM  Depression screen PHQ 2/9  Decreased Interest 0 0  Down, Depressed, Hopeless 0 0  PHQ - 2 Score 0 0  Altered sleeping 1 1  Tired, decreased energy 1 0  Change in appetite 0 0  Feeling bad or failure about yourself  0 0  Trouble concentrating 0 0  Moving slowly or fidgety/restless 0 0  Suicidal thoughts 0 0  PHQ-9 Score 2 1        04/19/2023    1:41 PM 03/31/2023   10:17 AM  GAD 7 : Generalized Anxiety Score  Nervous, Anxious, on Edge 1 1  Control/stop worrying 1 0  Worry too much - different things 1 0  Trouble relaxing 0 0  Restless 0 0  Easily annoyed or irritable 1 0  Afraid - awful might happen 1 0  Total GAD 7 Score 5 1     Review of Systems:   Pertinent items are noted in HPI Denies abnormal vaginal discharge w/ itching/odor/irritation, headaches, visual changes, shortness of breath, chest pain, abdominal pain, severe nausea/vomiting, or problems with urination or bowel movements unless otherwise stated above. Pertinent History Reviewed:  Reviewed past medical,surgical, social, obstetrical and family history.  Reviewed problem list, medications and allergies. Physical Assessment:   Vitals:   09/29/23 1017  BP: 99/69  Pulse: 84  Weight: 271 lb 6.4 oz (123.1 kg)  Body mass index is 49.64 kg/m.           Physical Examination:   General appearance: alert, well appearing, and in no distress  Mental status:  alert, oriented to person, place, and time  Skin: warm & dry   Extremities: Edema: None    Cardiovascular: normal heart rate noted  Respiratory: normal respiratory effort, no distress  Abdomen: gravid, soft, non-tender  Pelvic: Cervical exam performed  Dilation: 1.5 Effacement (%): 50 Station: -2  Fetal Status: Fetal Heart Rate (bpm): 134 u/s   Movement: Present    Fetal Surveillance Testing today: [place]  36+1 wks,cephalic,BPP 8/8,posterior placenta gr 2,AFI 19 cm,FHR 134 bpm,EFW 2641 g 30%    No results found for this or any previous visit (from the past 24 hours).  Assessment & Plan:  High-risk pregnancy: G2P1001 at [redacted]w[redacted]d with an Estimated Date of Delivery: 10/26/23   1) PG BMI 44, currently 49; EFW 30%   Meds: No orders of the defined types were placed in this encounter.   Labs/procedures today: GBS, GC/CT, and SVE  Treatment Plan:  continue 2x/wk testing; IOL 39-40wks  Reviewed: Term labor symptoms and general obstetric precautions including but not limited to vaginal bleeding, contractions, leaking of fluid and fetal movement were reviewed in detail with the patient.  All questions were answered. Does have home bp cuff. Office bp cuff given: not applicable. Check bp daily, let us  know if consistently >140 and/or >90.  Follow-up: Return for As scheduled.   Future Appointments  Date  Time Provider Department Center  10/06/2023  9:15 AM [facility] - FT IMG 2 [facility] None  10/06/2023 10:10 AM Jolayne Natter, CNM [facility] FTOBGYN  10/13/2023  8:30 AM [facility] - FT IMG 2 [facility] None  10/13/2023  9:30 AM Ferd Householder, CNM [facility] FTOBGYN  10/20/2023  8:30 AM [facility] - FTOBGYN US  [facility]FT[facility]G None  10/20/2023  9:30 AM Ozan, Jennifer, DO [facility]FT FTOBGYN    Orders Placed This Encounter  Procedures   Culture, beta strep (group b only)   Jolayne Natter [facility] 09/29/2023 10:42 AM

## 2023-09-29 NOTE — Progress Notes (Signed)
 [place]  36+1 wks,cephalic,BPP 8/8,posterior placenta gr 2,AFI 19 cm,FHR 134 bpm,EFW 2641 g 30%

## 2023-09-30 LAB — CERVICOVAGINAL ANCILLARY ONLY
Chlamydia: NEGATIVE
Comment: NEGATIVE
Comment: NORMAL
Neisseria Gonorrhea: NEGATIVE

## 2023-10-03 LAB — CULTURE, BETA STREP (GROUP B ONLY): Strep Gp B Culture: NEGATIVE

## 2023-10-06 ENCOUNTER — Ambulatory Visit: Payer: Medicaid Other | Admitting: Radiology

## 2023-10-06 ENCOUNTER — Ambulatory Visit: Payer: Medicaid Other | Admitting: Advanced Practice Midwife

## 2023-10-06 ENCOUNTER — Encounter: Payer: Self-pay | Admitting: Advanced Practice Midwife

## 2023-10-06 VITALS — BP 110/75 | HR 92 | Wt 274.0 lb

## 2023-10-06 DIAGNOSIS — Z3A37 37 weeks gestation of pregnancy: Secondary | ICD-10-CM | POA: Diagnosis not present

## 2023-10-06 DIAGNOSIS — O0993 Supervision of high risk pregnancy, unspecified, third trimester: Secondary | ICD-10-CM | POA: Diagnosis not present

## 2023-10-06 DIAGNOSIS — Z1389 Encounter for screening for other disorder: Secondary | ICD-10-CM | POA: Diagnosis not present

## 2023-10-06 DIAGNOSIS — Z6841 Body Mass Index (BMI) 40.0 and over, adult: Secondary | ICD-10-CM

## 2023-10-06 DIAGNOSIS — Z331 Pregnant state, incidental: Secondary | ICD-10-CM

## 2023-10-06 DIAGNOSIS — O0990 Supervision of high risk pregnancy, unspecified, unspecified trimester: Secondary | ICD-10-CM

## 2023-10-06 LAB — POCT URINALYSIS DIPSTICK OB
Blood, UA: NEGATIVE
Glucose, UA: NEGATIVE
Ketones, UA: NEGATIVE
Leukocytes, UA: NEGATIVE
Nitrite, UA: NEGATIVE

## 2023-10-06 NOTE — Progress Notes (Signed)
 [place] : GA = 37+1 weeks Single active female fetus, cephalic, FHR = 140 bpm, posterior pl, gr1,  AFI = 14.5 cm, 56%, MVP = 6 cm   BPP = 8/8

## 2023-10-06 NOTE — Progress Notes (Signed)
 HIGH-RISK PREGNANCY VISIT Patient name: Theresa Brady MRN [id number]  Date of birth: 03-15-1996 Chief Complaint:   Routine Prenatal Visit and Pregnancy Ultrasound  History of Present Illness:   LOUEEN Brady is a 28 y.o. G72P1001 female at [redacted]w[redacted]d with an Estimated Date of Delivery: 10/26/23 being seen today for ongoing management of a high-risk pregnancy complicated by PG BMI 44.    Today she reports  generalized discomfort/fatigue but doing okay . Contractions: Not present.  .  Movement: Present. denies leaking of fluid.      04/19/2023    1:41 PM 03/31/2023   10:17 AM  Depression screen PHQ 2/9  Decreased Interest 0 0  Down, Depressed, Hopeless 0 0  PHQ - 2 Score 0 0  Altered sleeping 1 1  Tired, decreased energy 1 0  Change in appetite 0 0  Feeling bad or failure about yourself  0 0  Trouble concentrating 0 0  Moving slowly or fidgety/restless 0 0  Suicidal thoughts 0 0  PHQ-9 Score 2 1        04/19/2023    1:41 PM 03/31/2023   10:17 AM  GAD 7 : Generalized Anxiety Score  Nervous, Anxious, on Edge 1 1  Control/stop worrying 1 0  Worry too much - different things 1 0  Trouble relaxing 0 0  Restless 0 0  Easily annoyed or irritable 1 0  Afraid - awful might happen 1 0  Total GAD 7 Score 5 1     Review of Systems:   Pertinent items are noted in HPI Denies abnormal vaginal discharge w/ itching/odor/irritation, headaches, visual changes, shortness of breath, chest pain, abdominal pain, severe nausea/vomiting, or problems with urination or bowel movements unless otherwise stated above. Pertinent History Reviewed:  Reviewed past medical,surgical, social, obstetrical and family history.  Reviewed problem list, medications and allergies. Physical Assessment:   Vitals:   10/06/23 0931  BP: 110/75  Pulse: 92  Weight: 274 lb (124.3 kg)  Body mass index is 50.12 kg/m.           Physical Examination:   General appearance: alert, well appearing, and in no  distress  Mental status: alert, oriented to person, place, and time  Skin: warm & dry   Extremities: Edema: None    Cardiovascular: normal heart rate noted  Respiratory: normal respiratory effort, no distress  Abdomen: gravid, soft, non-tender  Pelvic: Cervical exam deferred         Fetal Status: Fetal Heart Rate (bpm): 140 u/s   Movement: Present    Fetal Surveillance Testing today: US : GA = 37+1 weeks Single active female fetus, cephalic, FHR = 140 bpm, posterior pl, gr1,  AFI = 14.5 cm, 56%, MVP = 6 cm   BPP = 8/8    Results for orders placed or performed in visit on 10/06/23 (from the past 24 hours)  POC Urinalysis Dipstick OB   Collection Time: 10/06/23  9:37 AM  Result Value Ref Range   Color, UA     Clarity, UA     Glucose, UA Negative Negative   Bilirubin, UA     Ketones, UA negative    Spec Grav, UA     Blood, UA negative    pH, UA     POC,PROTEIN,UA Trace Negative, Trace, Small (1+), Moderate (2+), Large (3+), 4+   Urobilinogen, UA     Nitrite, UA negative    Leukocytes, UA Negative Negative   Appearance     Odor  Assessment & Plan:  High-risk pregnancy: G2P1001 at [redacted]w[redacted]d with an Estimated Date of Delivery: 10/26/23   1) PG BMI 44 , currently 50; EFW 30% @ 36wks; weekly testing with IOL 39-40wks (set up @ [facility])   Meds: No orders of the defined types were placed in this encounter.   Labs/procedures today: U/S  Treatment Plan:  weekly BPP; IOL 39-40wks  Reviewed: Term labor symptoms and general obstetric precautions including but not limited to vaginal bleeding, contractions, leaking of fluid and fetal movement were reviewed in detail with the patient.  All questions were answered. Does have home bp cuff. Office bp cuff given: not applicable. Check bp daily, let us  know if consistently >140 and/or >90.  Follow-up: Return for As scheduled.   Future Appointments  Date Time Provider Department Center  10/06/2023 10:10 AM Jolayne Natter, CNM [facility]   10/13/2023  8:30 AM [facility] - [facility] 2 [facility] None  10/13/2023  9:30 AM Ferd Householder, CNM [facility]  10/20/2023  8:30 AM [facility]  [facility] None  10/20/2023  9:30 AM Ozan, Jennifer, DO [facility]    Orders Placed This Encounter  Procedures   POC Urinalysis Dipstick OB   Jolayne Natter [facility] 10/06/2023 9:55 AM

## 2023-10-13 ENCOUNTER — Ambulatory Visit: Admitting: Women's Health

## 2023-10-13 ENCOUNTER — Ambulatory Visit (INDEPENDENT_AMBULATORY_CARE_PROVIDER_SITE_OTHER): Payer: Medicaid Other | Admitting: Radiology

## 2023-10-13 ENCOUNTER — Encounter: Payer: Self-pay | Admitting: Women's Health

## 2023-10-13 VITALS — BP 108/72 | HR 85 | Wt 277.6 lb

## 2023-10-13 DIAGNOSIS — Z6841 Body Mass Index (BMI) 40.0 and over, adult: Secondary | ICD-10-CM

## 2023-10-13 DIAGNOSIS — Z3A38 38 weeks gestation of pregnancy: Secondary | ICD-10-CM | POA: Diagnosis not present

## 2023-10-13 DIAGNOSIS — O0993 Supervision of high risk pregnancy, unspecified, third trimester: Secondary | ICD-10-CM

## 2023-10-13 NOTE — Progress Notes (Signed)
 HIGH-RISK PREGNANCY VISIT Patient name: Theresa Brady MRN [id number]  Date of birth: 09-10-1995 Chief Complaint:   Routine Prenatal Visit  History of Present Illness:   Theresa Brady is a 28 y.o. G20P1001 female at [redacted]w[redacted]d with an Estimated Date of Delivery: 10/26/23 being seen today for ongoing management of a high-risk pregnancy complicated by PG BMI 44.    Today she reports no complaints. Contractions: Not present. Vag. Bleeding: None.  Movement: Present. denies leaking of fluid.      04/19/2023    1:41 PM 03/31/2023   10:17 AM  Depression screen PHQ 2/9  Decreased Interest 0 0  Down, Depressed, Hopeless 0 0  PHQ - 2 Score 0 0  Altered sleeping 1 1  Tired, decreased energy 1 0  Change in appetite 0 0  Feeling bad or failure about yourself  0 0  Trouble concentrating 0 0  Moving slowly or fidgety/restless 0 0  Suicidal thoughts 0 0  PHQ-9 Score 2 1        04/19/2023    1:41 PM 03/31/2023   10:17 AM  GAD 7 : Generalized Anxiety Score  Nervous, Anxious, on Edge 1 1  Control/stop worrying 1 0  Worry too much - different things 1 0  Trouble relaxing 0 0  Restless 0 0  Easily annoyed or irritable 1 0  Afraid - awful might happen 1 0  Total GAD 7 Score 5 1     Review of Systems:   Pertinent items are noted in HPI Denies abnormal vaginal discharge w/ itching/odor/irritation, headaches, visual changes, shortness of breath, chest pain, abdominal pain, severe nausea/vomiting, or problems with urination or bowel movements unless otherwise stated above. Pertinent History Reviewed:  Reviewed past medical,surgical, social, obstetrical and family history.  Reviewed problem list, medications and allergies. Physical Assessment:   Vitals:   10/13/23 0914  BP: 108/72  Pulse: 85  Weight: 277 lb 9.6 oz (125.9 kg)  Body mass index is 50.77 kg/m.           Physical Examination:   General appearance: alert, well appearing, and in no distress  Mental status: alert, oriented  to person, place, and time  Skin: warm & dry   Extremities:      Cardiovascular: normal heart rate noted  Respiratory: normal respiratory effort, no distress  Abdomen: gravid, soft, non-tender  Pelvic: Cervical exam deferred         Fetal Status:     Movement: Present    Fetal Surveillance Testing today: US : GA = 38+1 weeks Single active fetus, cephalic, FHR = 138 bpm,  AFI = 17.7 cm, 76%, MVP = 6.8 cm, BPP = 8/8  Chaperone: N/A  No results found for this or any previous visit (from the past 24 hours).  Assessment & Plan:  High-risk pregnancy: G2P1001 at [redacted]w[redacted]d with an Estimated Date of Delivery: 10/26/23   1) PGBMI 44, currently 50, EFW 30% @ 36w, discussed IOL timing, wants 39w, scheduled for 5/14 AM,  IOL form faxed and orders placed   Meds: No orders of the defined types were placed in this encounter.  Labs/procedures today: U/S  Treatment Plan:  IOL as scheduled  Reviewed: Term labor symptoms and general obstetric precautions including but not limited to vaginal bleeding, contractions, leaking of fluid and fetal movement were reviewed in detail with the patient.  All questions were answered. Does have home bp cuff. Office bp cuff given: not applicable. Check bp weekly, let us  know if  consistently >140 and/or >90.  Follow-up: Return for cancel 5/14 u/s & appt, will schedule pp visit after delivery.   No future appointments.   No orders of the defined types were placed in this encounter.  Ferd Householder CNM, [facility] 10/13/2023 9:49 AM

## 2023-10-13 NOTE — Progress Notes (Signed)
 US : GA = 38+1 weeks Single active fetus, cephalic, FHR = 138 bpm,  AFI = 17.7 cm, 76%, MVP = 6.8 cm, BPP = 8/8

## 2023-10-13 NOTE — Patient Instructions (Signed)
Theresa Brady, thank you for choosing our office today! We appreciate the opportunity to meet your healthcare needs. You may receive a short survey by mail, e-mail, or through MyChart. If you are happy with your care we would appreciate if you could take just a few minutes to complete the survey questions. We read all of your comments and take your feedback very seriously. Thank you again for choosing our office.  [facility]  [facility] ([address] [place], [place] [address]) Entrance C, located off of [address] Free 24/7 valet parking   CLASSES: Go to [email or web address] to register for classes (childbirth, breastfeeding, waterbirth, infant CPR, daddy bootcamp, etc.)  Call the office ([phone]) or go to [facility] if: You begin to have strong, frequent contractions Your water breaks.  Sometimes it is a big gush of fluid, sometimes it is just a trickle that keeps getting your panties wet or running down your legs You have vaginal bleeding.  It is normal to have a small amount of spotting if your cervix was checked.  You don't feel your baby moving like normal.  If you don't, get you something to eat and drink and lay down and focus on feeling your baby move.   If your baby is still not moving like normal, you should call the office or go to [facility].  Call the office ([phone]) or go to [facility] for these signs of pre-eclampsia: Severe headache that does not go away with Tylenol Visual changes- seeing spots, double, blurred vision Pain under your right breast or upper abdomen that does not go away with Tums or heartburn medicine Nausea and/or vomiting Severe swelling in your hands, feet, and face   [place] Pediatric[facility]/Family Doctors [facility] ([facility]): [address], [phone]           [facility]: [address], [phone]                 [facility] ([facility]): [address], [phone] (call to ask if accepting patients) [facility]: [address], [place], [phone]    [place] Pediatric[facility]/Family Doctors [facility] ([facility]): [address], [phone] [facility]: [address], [phone] [facility]: [address], [phone]  [place] [facility]  [facility] ([facility]): [phone] [facility]: [address], [phone]   [place] [facility]: [address], [phone]  [place] [facility]  [facility]: [phone], [phone]  Home Blood Pressure Monitoring for Patients   Your provider has recommended that you check your blood pressure (BP) at least once a week at home. If you do not have a blood pressure cuff at home, one will be provided for you. Contact your provider if you have not received your monitor within 1 week.   Helpful Tips for Accurate Home Blood Pressure Checks  Don't smoke, exercise, or drink caffeine 30 minutes before checking your BP Use the restroom before checking your BP (a full bladder can raise your pressure) Relax in a comfortable upright chair Feet on the ground Left arm resting comfortably on a flat surface at the level of your heart Legs uncrossed Back supported Sit quietly and don't talk Place the cuff on your bare arm Adjust snuggly, so that only two fingertips   can fit between your skin and the top of the cuff Check 2 readings separated by at least one minute Keep a log of your BP readings For a visual, please reference this diagram: http://ccnc.care/bpdiagram  Provider Name: [facility]     Phone: [phone]  Zone 1: ALL CLEAR  Continue to monitor your symptoms:  BP reading is less than 140 (top number) or less than 90 (bottom number)  No right  upper stomach pain No headaches or seeing spots No feeling nauseated or throwing up No swelling in face and hands  Zone 2: CAUTION Call your doctor's office for any of the following:  BP reading is greater than 140 (top number) or greater than 90 (bottom number)  Stomach pain under your ribs in the middle or right side Headaches or seeing spots Feeling nauseated or throwing up Swelling in face and hands  Zone 3: EMERGENCY  Seek immediate medical care if you have any of the following:  BP reading is greater than160 (top number) or greater than 110 (bottom number) Severe headaches not improving with Tylenol Serious difficulty catching your breath Any worsening symptoms from Zone 2   Braxton Hicks Contractions Contractions of the uterus can occur throughout pregnancy, but they are not always a sign that you are in labor. You may have practice contractions called Braxton Hicks contractions. These false labor contractions are sometimes confused with true labor. What are Braxton Hicks contractions? Braxton Hicks contractions are tightening movements that occur in the muscles of the uterus before labor. Unlike true labor contractions, these contractions do not result in opening (dilation) and thinning of the cervix. Toward the end of pregnancy (32-34 weeks), Braxton Hicks contractions can happen more often and may become stronger. These contractions are sometimes difficult to tell apart from true labor because they can be very uncomfortable. You should not feel embarrassed if you go to the hospital with false labor. Sometimes, the only way to tell if you are in true labor is for your health care provider to look for changes in the cervix. The health care provider will do a physical exam and may monitor your contractions. If you are not in true labor, the exam should show that your cervix is not dilating and your water has not broken. If there are no other health problems associated with your  pregnancy, it is completely safe for you to be sent home with false labor. You may continue to have Braxton Hicks contractions until you go into true labor. How to tell the difference between true labor and false labor True labor Contractions last 30-70 seconds. Contractions become very regular. Discomfort is usually felt in the top of the uterus, and it spreads to the lower abdomen and low back. Contractions do not go away with walking. Contractions usually become more intense and increase in frequency. The cervix dilates and gets thinner. False labor Contractions are usually shorter and not as strong as true labor contractions. Contractions are usually irregular. Contractions are often felt in the front of the lower abdomen and in the groin. Contractions may go away when you walk around or change positions while lying down. Contractions get weaker and are shorter-lasting as time goes on. The cervix usually does not dilate or become thin. Follow these instructions at home:  Take over-the-counter and prescription medicines only as told by your health care provider. Keep up with your usual exercises and follow other instructions from your health care provider. Eat and drink lightly if you think   you are going into labor. If Braxton Hicks contractions are making you uncomfortable: Change your position from lying down or resting to walking, or change from walking to resting. Sit and rest in a tub of warm water. Drink enough fluid to keep your urine pale yellow. Dehydration may cause these contractions. Do slow and deep breathing several times an hour. Keep all follow-up prenatal visits as told by your health care provider. This is important. Contact a health care provider if: You have a fever. You have continuous pain in your abdomen. Get help right away if: Your contractions become stronger, more regular, and closer together. You have fluid leaking or gushing from your vagina. You pass  blood-tinged mucus (bloody show). You have bleeding from your vagina. You have low back pain that you never had before. You feel your baby's head pushing down and causing pelvic pressure. Your baby is not moving inside you as much as it used to. Summary Contractions that occur before labor are called Braxton Hicks contractions, false labor, or practice contractions. Braxton Hicks contractions are usually shorter, weaker, farther apart, and less regular than true labor contractions. True labor contractions usually become progressively stronger and regular, and they become more frequent. Manage discomfort from Braxton Hicks contractions by changing position, resting in a warm bath, drinking plenty of water, or practicing deep breathing. This information is not intended to replace advice given to you by your health care provider. Make sure you discuss any questions you have with your health care provider. Document Revised: 05/07/2017 Document Reviewed: 10/08/2016 Elsevier Patient Education  2020 [employer] Inc[employer]   

## 2023-10-18 DIAGNOSIS — Z419 Encounter for procedure for purposes other than remedying health state, unspecified: Secondary | ICD-10-CM | POA: Diagnosis not present

## 2023-10-20 ENCOUNTER — Inpatient Hospital Stay (HOSPITAL_COMMUNITY)
Admission: RE | Admit: 2023-10-20 | Discharge: 2023-10-22 | DRG: 807 | Disposition: A | Discharge status: Home / Self Care | Attending: Obstetrics and Gynecology | Admitting: Obstetrics and Gynecology

## 2023-10-20 ENCOUNTER — Other Ambulatory Visit: Payer: Medicaid Other

## 2023-10-20 ENCOUNTER — Inpatient Hospital Stay (HOSPITAL_COMMUNITY)

## 2023-10-20 ENCOUNTER — Encounter: Admitting: Obstetrics & Gynecology

## 2023-10-20 ENCOUNTER — Other Ambulatory Visit: Payer: Self-pay

## 2023-10-20 ENCOUNTER — Encounter (HOSPITAL_COMMUNITY): Payer: Self-pay | Admitting: Obstetrics & Gynecology

## 2023-10-20 ENCOUNTER — Inpatient Hospital Stay (HOSPITAL_COMMUNITY): Admitting: Anesthesiology

## 2023-10-20 DIAGNOSIS — Z3A39 39 weeks gestation of pregnancy: Secondary | ICD-10-CM | POA: Diagnosis not present

## 2023-10-20 DIAGNOSIS — F17210 Nicotine dependence, cigarettes, uncomplicated: Secondary | ICD-10-CM | POA: Diagnosis not present

## 2023-10-20 DIAGNOSIS — O99214 Obesity complicating childbirth: Secondary | ICD-10-CM | POA: Diagnosis not present

## 2023-10-20 DIAGNOSIS — Z8249 Family history of ischemic heart disease and other diseases of the circulatory system: Secondary | ICD-10-CM | POA: Diagnosis not present

## 2023-10-20 DIAGNOSIS — Z6841 Body Mass Index (BMI) 40.0 and over, adult: Secondary | ICD-10-CM

## 2023-10-20 DIAGNOSIS — O26893 Other specified pregnancy related conditions, third trimester: Secondary | ICD-10-CM | POA: Diagnosis not present

## 2023-10-20 DIAGNOSIS — O0993 Supervision of high risk pregnancy, unspecified, third trimester: Secondary | ICD-10-CM

## 2023-10-20 DIAGNOSIS — O99334 Smoking (tobacco) complicating childbirth: Secondary | ICD-10-CM | POA: Diagnosis not present

## 2023-10-20 DIAGNOSIS — O0990 Supervision of high risk pregnancy, unspecified, unspecified trimester: Principal | ICD-10-CM

## 2023-10-20 LAB — CBC
HCT: 37.9 % (ref 36.0–46.0)
Hemoglobin: 12.8 g/dL (ref 12.0–15.0)
MCH: 28.7 pg (ref 26.0–34.0)
MCHC: 33.8 g/dL (ref 30.0–36.0)
MCV: 85 fL (ref 80.0–100.0)
Platelets: 190 10*3/uL (ref 150–400)
RBC: 4.46 MIL/uL (ref 3.87–5.11)
RDW: 13.4 % (ref 11.5–15.5)
WBC: 9.7 10*3/uL (ref 4.0–10.5)
nRBC: 0 % (ref 0.0–0.2)

## 2023-10-20 LAB — TYPE AND SCREEN
ABO/RH(D): A POS
Antibody Screen: NEGATIVE

## 2023-10-20 LAB — RPR: RPR Ser Ql: NONREACTIVE

## 2023-10-20 MED ORDER — OXYTOCIN-SODIUM CHLORIDE 30-0.9 UT/500ML-% IV SOLN
2.5000 [IU]/h | INTRAVENOUS | Status: DC
  Administered 2023-10-20: 2.5 [IU]/h via INTRAVENOUS

## 2023-10-20 MED ORDER — EPHEDRINE 5 MG/ML INJ: 10.0000 mg | INTRAVENOUS | Status: DC | PRN

## 2023-10-20 MED ORDER — PHENYLEPHRINE 80 MCG/ML (10ML) SYRINGE FOR IV PUSH (FOR BLOOD PRESSURE SUPPORT): 80.0000 ug | PREFILLED_SYRINGE | INTRAVENOUS | Status: DC | PRN

## 2023-10-20 MED ORDER — LIDOCAINE HCL (PF) 1 % IJ SOLN: 30.0000 mL | INTRAMUSCULAR | Status: DC | PRN

## 2023-10-20 MED ORDER — ONDANSETRON HCL 4 MG PO TABS: 4.0000 mg | ORAL_TABLET | ORAL | Status: DC | PRN

## 2023-10-20 MED ORDER — DIBUCAINE (PERIANAL) 1 % EX OINT: 1.0000 | TOPICAL_OINTMENT | CUTANEOUS | Status: DC | PRN

## 2023-10-20 MED ORDER — SOD CITRATE-CITRIC ACID 500-334 MG/5ML PO SOLN: 30.0000 mL | ORAL | Status: DC | PRN

## 2023-10-20 MED ORDER — SODIUM CHLORIDE 0.9% FLUSH: 3.0000 mL | INTRAVENOUS | Status: DC | PRN

## 2023-10-20 MED ORDER — WITCH HAZEL-GLYCERIN EX PADS: 1.0000 | MEDICATED_PAD | CUTANEOUS | Status: DC | PRN

## 2023-10-20 MED ORDER — LIDOCAINE HCL (PF) 1 % IJ SOLN
INTRAMUSCULAR | Status: DC | PRN
  Administered 2023-10-20: 10 mL via EPIDURAL

## 2023-10-20 MED ORDER — DIPHENHYDRAMINE HCL 50 MG/ML IJ SOLN: 12.5000 mg | INTRAMUSCULAR | Status: DC | PRN

## 2023-10-20 MED ORDER — FENTANYL CITRATE (PF) 100 MCG/2ML IJ SOLN: 100.0000 ug | INTRAMUSCULAR | Status: DC | PRN

## 2023-10-20 MED ORDER — SIMETHICONE 80 MG PO CHEW: 80.0000 mg | CHEWABLE_TABLET | ORAL | Status: DC | PRN

## 2023-10-20 MED ORDER — OXYCODONE-ACETAMINOPHEN 5-325 MG PO TABS: 2.0000 | ORAL_TABLET | ORAL | Status: DC | PRN

## 2023-10-20 MED ORDER — OXYTOCIN-SODIUM CHLORIDE 30-0.9 UT/500ML-% IV SOLN
1.0000 m[IU]/min | INTRAVENOUS | Status: DC
  Administered 2023-10-20: 2 m[IU]/min via INTRAVENOUS
  Filled 2023-10-20: qty 500

## 2023-10-20 MED ORDER — OXYTOCIN BOLUS FROM INFUSION
333.0000 mL | Freq: Once | INTRAVENOUS | Status: AC
  Administered 2023-10-20: 333 mL via INTRAVENOUS

## 2023-10-20 MED ORDER — HYDROXYZINE HCL 50 MG PO TABS: 50.0000 mg | ORAL_TABLET | Freq: Four times a day (QID) | ORAL | Status: DC | PRN

## 2023-10-20 MED ORDER — FLEET ENEMA RE ENEM
1.0000 | ENEMA | RECTAL | Status: DC | PRN
Start: 2023-10-20 — End: 2023-10-20

## 2023-10-20 MED ORDER — LACTATED RINGERS IV SOLN
500.0000 mL | Freq: Once | INTRAVENOUS | Status: DC
Start: 2023-10-20 — End: 2023-10-20

## 2023-10-20 MED ORDER — SENNOSIDES-DOCUSATE SODIUM 8.6-50 MG PO TABS
2.0000 | ORAL_TABLET | ORAL | Status: DC
  Administered 2023-10-21 – 2023-10-22 (×2): 2 via ORAL
  Filled 2023-10-20 (×2): qty 2

## 2023-10-20 MED ORDER — COCONUT OIL OIL: 1.0000 | TOPICAL_OIL | Status: DC | PRN

## 2023-10-20 MED ORDER — SODIUM CHLORIDE 0.9 % IV SOLN: 250.0000 mL | INTRAVENOUS | Status: DC | PRN

## 2023-10-20 MED ORDER — OXYCODONE-ACETAMINOPHEN 5-325 MG PO TABS: 1.0000 | ORAL_TABLET | ORAL | Status: DC | PRN

## 2023-10-20 MED ORDER — MISOPROSTOL 50MCG HALF TABLET: 50.0000 ug | ORAL_TABLET | Freq: Once | ORAL | Status: DC

## 2023-10-20 MED ORDER — MISOPROSTOL 25 MCG QUARTER TABLET
25.0000 ug | ORAL_TABLET | Freq: Once | ORAL | Status: AC
  Administered 2023-10-20: 25 ug via VAGINAL
  Filled 2023-10-20 (×2): qty 1

## 2023-10-20 MED ORDER — ZOLPIDEM TARTRATE 5 MG PO TABS: 5.0000 mg | ORAL_TABLET | Freq: Every evening | ORAL | Status: DC | PRN

## 2023-10-20 MED ORDER — SODIUM CHLORIDE 0.9% FLUSH: 3.0000 mL | Freq: Two times a day (BID) | INTRAVENOUS | Status: DC

## 2023-10-20 MED ORDER — IBUPROFEN 600 MG PO TABS
600.0000 mg | ORAL_TABLET | Freq: Four times a day (QID) | ORAL | Status: DC
  Administered 2023-10-20 – 2023-10-22 (×7): 600 mg via ORAL
  Filled 2023-10-20 (×7): qty 1

## 2023-10-20 MED ORDER — LACTATED RINGERS IV SOLN: INTRAVENOUS | Status: DC

## 2023-10-20 MED ORDER — ACETAMINOPHEN 325 MG PO TABS: 650.0000 mg | ORAL_TABLET | ORAL | Status: DC | PRN

## 2023-10-20 MED ORDER — TERBUTALINE SULFATE 1 MG/ML IJ SOLN: 0.2500 mg | Freq: Once | INTRAMUSCULAR | Status: DC | PRN

## 2023-10-20 MED ORDER — FENTANYL-BUPIVACAINE-NACL 0.5-0.125-0.9 MG/250ML-% EP SOLN
12.0000 mL/h | EPIDURAL | Status: DC | PRN
  Administered 2023-10-20: 12 mL/h via EPIDURAL
  Filled 2023-10-20: qty 250

## 2023-10-20 MED ORDER — ONDANSETRON HCL 4 MG/2ML IJ SOLN
4.0000 mg | Freq: Four times a day (QID) | INTRAMUSCULAR | Status: DC | PRN
Start: 2023-10-20 — End: 2023-10-20

## 2023-10-20 MED ORDER — PRENATAL MULTIVITAMIN CH
1.0000 | ORAL_TABLET | Freq: Every day | ORAL | Status: DC
  Administered 2023-10-21 – 2023-10-22 (×2): 1 via ORAL
  Filled 2023-10-20 (×2): qty 1

## 2023-10-20 MED ORDER — LACTATED RINGERS IV SOLN: 500.0000 mL | INTRAVENOUS | Status: DC | PRN

## 2023-10-20 MED ORDER — ONDANSETRON HCL 4 MG/2ML IJ SOLN: 4.0000 mg | INTRAMUSCULAR | Status: DC | PRN

## 2023-10-20 MED ORDER — DIPHENHYDRAMINE HCL 25 MG PO CAPS: 25.0000 mg | ORAL_CAPSULE | Freq: Four times a day (QID) | ORAL | Status: DC | PRN

## 2023-10-20 MED ORDER — BENZOCAINE-MENTHOL 20-0.5 % EX AERO
1.0000 | INHALATION_SPRAY | CUTANEOUS | Status: DC | PRN
  Administered 2023-10-21: 1 via TOPICAL
  Filled 2023-10-20: qty 56

## 2023-10-20 NOTE — Progress Notes (Signed)
 Labor Progress Note  Theresa Brady is a 28 y.o. G2P1001 at [redacted]w[redacted]d presented for induction of labor due to high-risk pregnancy complicated by BMI of 44.  S: Comfortable, laying on bed. She is amenable to pelvic exam cervical check and we discussed the risks and benefits of AROM. Patient voiced understanding and was amenable to AROM.  O:  BP 110/74   Pulse 78   Temp 98.2 F (36.8 C) (Oral)   Resp 20   Ht 5\' 2"  (1.575 m)   Wt 129.6 kg   LMP  (LMP Unknown) Comment: no period in 4-5 months  BMI 52.26 kg/m  EFM:135 bpm/Moderate variability/ none accels/ None decels CAT: 1 Toco: regular, every 6 minutes   CVE: Dilation: 3 Effacement (%): 50 Cervical Position: Anterior Station: -2 Presentation: Vertex Exam by:: Theresa Brady   A&P: 28 y.o. G2P1001 [redacted]w[redacted]d  here for induction of labor due to high-risk pregnancy complicated by PG BMI of 44 as above  #Labor: Progressing well. Amniotic membranes were artificially ruptured by supervising physician, clear fluid. Mom and baby tolerated well. Patient had no further questions. #Pain: Per pt request #FWB: CAT 1 #GBS negative  Park Bolk, Medical Student 10/20/23  11:50 AM  Evaluation and management procedures were performed by the MS3 Theresa Brady under my supervision. I was immediately available for direct supervision, assistance and direction throughout this encounter.  I also confirm that I have verified the information documented in the student's note, and that I have also personally reperformed the pertinent components of the physical exam and all of the medical decision making activities.  I have also made any necessary editorial changes.   Theresa Chalet, MD Family Medicine - Obstetrics Fellow

## 2023-10-20 NOTE — Anesthesia Procedure Notes (Signed)
 Epidural Patient location during procedure: OB Start time: 10/20/2023 2:45 PM End time: 10/20/2023 2:55 PM  Staffing Anesthesiologist: Grace Laura, MD Performed: anesthesiologist   Preanesthetic Checklist Completed: patient identified, IV checked, risks and benefits discussed, monitors and equipment checked, pre-op evaluation and timeout performed  Epidural Patient position: sitting Prep: DuraPrep and site prepped and draped Patient monitoring: continuous pulse ox, blood pressure, heart rate and cardiac monitor Approach: midline Location: L3-L4 Injection technique: LOR air  Needle:  Needle type: Tuohy  Needle gauge: 17 G Needle length: 9 cm Needle insertion depth: 8 cm Catheter type: closed end flexible Catheter size: 19 Gauge Catheter at skin depth: 14 cm Test dose: negative  Assessment Sensory level: T8 Events: blood not aspirated, no cerebrospinal fluid, injection not painful, no injection resistance, no paresthesia and negative IV test  Additional Notes Patient identified. Risks/Benefits/Options discussed with patient including but not limited to bleeding, infection, nerve damage, paralysis, failed block, incomplete pain control, headache, blood pressure changes, nausea, vomiting, reactions to medication both or allergic, itching and postpartum back pain. Confirmed with bedside nurse the patient's most recent platelet count. Confirmed with patient that they are not currently taking any anticoagulation, have any bleeding history or any family history of bleeding disorders. Patient expressed understanding and wished to proceed. All questions were answered. Sterile technique was used throughout the entire procedure. Please see nursing notes for vital signs. Test dose was given through epidural catheter and negative prior to continuing to dose epidural or start infusion. Warning signs of high block given to the patient including shortness of breath, tingling/numbness in hands,  complete motor block, or any concerning symptoms with instructions to call for help. Patient was given instructions on fall risk and not to get out of bed. All questions and concerns addressed with instructions to call with any issues or inadequate analgesia.  Reason for block:procedure for pain

## 2023-10-20 NOTE — H&P (Signed)
 Patient ID: Theresa Brady, female   DOB: Apr 25, 1996, 28 y.o.   MRN: [id number] OBSTETRIC ADMISSION HISTORY AND PHYSICAL   Theresa Brady is a 28 y.o. female G2P1001 with IUP at [redacted]w[redacted]d by LMP presenting for IOL due to high-risk pregnancy complicated by PG BMI 44. She reports +FMs, No LOF, no VB, no blurry vision, headaches or peripheral edema, and RUQ pain.  She plans on both breast and bottle feeding. She request POPS for birth control. She received her prenatal care at [facility]    Dating: By LMP --->  Estimated Date of Delivery: 10/26/23   Sono:     @[redacted]w[redacted]d , CWD, normal anatomy, cephalic presentation, posterior placental lie, 2641g, 30% EFW     Prenatal History/Complications:  - High-risk pregnancy complicated by PG BMI 44   Past Medical History:     Past Medical History:  Diagnosis Date   Medical history non-contributory     Pregnant            Past Surgical History:      Past Surgical History:  Procedure Laterality Date   CHOLECYSTECTOMY N/A 2011          Obstetrical History: OB History       Gravida  2   Para  1   Term  1   Preterm      AB      Living  1        SAB      IAB      Ectopic      Multiple      Live Births  1               Social History Social History         Socioeconomic History   Marital status: Married      Spouse name: Not on file   Number of children: Not on file   Years of education: Not on file   Highest education level: Not on file  Occupational History   Not on file  Tobacco Use   Smoking status: Every Day      Current packs/day: 0.25      Types: Cigarettes   Smokeless tobacco: Never  Vaping Use   Vaping status: Former  Substance and Sexual Activity   Alcohol use: No   Drug use: No   Sexual activity: Yes      Birth control/protection: None  Other Topics Concern   Not on file  Social History Narrative   Not on file    Social Drivers of Health        Financial Resource Strain: Low Risk   (03/31/2023)    Overall Financial Resource Strain (CARDIA)     Difficulty of Paying Living Expenses: Not hard at all  Food Insecurity: No Food Insecurity (10/20/2023)    Hunger Vital Sign     Worried About Running Out of Food in the Last Year: Never true     Ran Out of Food in the Last Year: Never true  Transportation Needs: No Transportation Needs (10/20/2023)    PRAPARE - Therapist, art (Medical): No     Lack of Transportation (Non-Medical): No  Physical Activity: Insufficiently Active (03/31/2023)    Exercise Vital Sign     Days of Exercise per Week: 2 days     Minutes of Exercise per Session: 20 min  Stress: No Stress Concern Present (03/31/2023)    [employer] of Occupational  Health - Occupational Stress Questionnaire     Feeling of Stress : Only a little  Social Connections: Moderately Integrated (03/31/2023)    Social Connection and Isolation Panel [NHANES]     Frequency of Communication with Friends and Family: Three times a week     Frequency of Social Gatherings with Friends and Family: Twice a week     Attends Religious Services: 1 to 4 times per year     Active Member of [employer] or Organizations: No     Attends Engineer, structural: Never     Marital Status: Married      Family History:      Family History  Problem Relation Age of Onset   Hypertension Father     Diverticulitis Father     Other Maternal Grandmother          sepsis   Stroke Paternal Grandmother            Allergies: Allergies  No Known Allergies            Medications Prior to Admission  Medication Sig Dispense Refill Last Dose/Taking   Prenatal Vit-Fe Fumarate-FA (PRENATAL VITAMIN PO) Take by mouth.     10/19/2023   aspirin  EC 81 MG tablet Take 2 tablets (162 mg total) by mouth daily. (Patient not taking: Reported on 08/04/2023) 60 tablet 6     diphenhydrAMINE  HCl (BENADRYL  ALLERGY PO) Take 25 mg by mouth once. (Patient not taking: Reported on  10/06/2023)                  Review of Systems    All systems reviewed and negative except as stated in HPI   Blood pressure 111/70, pulse 74, temperature 98 F (36.7 C), temperature source Oral, resp. rate 16, height 5\' 2"  (1.575 m), weight 129.6 kg. General appearance: alert, cooperative, and no distress Lungs: clear to auscultation bilaterally Heart: regular rate and rhythm Abdomen: soft, non-tender; bowel sounds normal Pelvic: normal female genitalia  Extremities: Homans sign is negative, no sign of DVT DTR's 2+ Presentation: cephalic Fetal monitoringBaseline: 120 bpm, Variability: Good {> 6 bpm), Accelerations: Reactive, and Decelerations: Absent Uterine activityNone Dilation: 4 Effacement (%): 80 Station: -2 Exam by:: Lima, rn     Prenatal labs: ABO, Rh: --/--/A POS (05/14 1610) Antibody: NEG (05/14 0651) Rubella: 1.32 (11/11 1515) RPR: NON REACTIVE (05/14 0625)  HBsAg: Negative (11/11 1515)  HIV: Non Reactive (02/26 0819)  GBS: Negative/-- (04/23 1100)    Recent Labs       Lab Results  Component Value Date    GBS Negative 09/29/2023      GTT WNL Genetic screening  negative Anatomy US  normal       Immunization History  Administered Date(s) Administered   Tdap 10/15/2012      Prenatal Transfer Tool  Maternal Diabetes: No Genetic Screening: Normal Maternal Ultrasounds/Referrals: Normal Fetal Ultrasounds or other Referrals:  None Maternal Substance Abuse:  No Significant Maternal Medications:  None Significant Maternal Lab Results: Group B Strep negative Number of Prenatal Visits:greater than 3 verified prenatal visits Maternal Vaccinations: declined  Other Comments:  None           Results for orders placed or performed during the hospital encounter of 10/20/23 (from the past 24 hours)  CBC    Collection Time: 10/20/23  6:25 AM  Result Value Ref Range    WBC 9.7 4.0 - 10.5 K/uL    RBC 4.46 3.87 - 5.11 MIL/uL  Hemoglobin 12.8 12.0 - 15.0  g/dL    HCT 16.1 09.6 - 04.5 %    MCV 85.0 80.0 - 100.0 fL    MCH 28.7 26.0 - 34.0 pg    MCHC 33.8 30.0 - 36.0 g/dL    RDW 40.9 81.1 - 91.4 %    Platelets 190 150 - 400 K/uL    nRBC 0.0 0.0 - 0.2 %  RPR    Collection Time: 10/20/23  6:25 AM  Result Value Ref Range    RPR Ser Ql NON REACTIVE NON REACTIVE  Type and screen    Collection Time: 10/20/23  6:51 AM  Result Value Ref Range    ABO/RH(D) A POS      Antibody Screen NEG      Sample Expiration          10/23/2023,2359 Performed at [facility], [address] [place], [place] [address]            Patient Active Problem List    Diagnosis Date Noted   BMI 50.0-59.9, adult (HCC) 10/20/2023   BMI 45.0-49.9, adult (HCC) 05/17/2023   Supervision of high risk pregnancy, antepartum 04/16/2023      Assessment/Plan:  Theresa Brady is a 28 y.o. G2P1001 at [redacted]w[redacted]d here for IOL due to high-risk pregnancy complicated by PG BMI 44   #Labor:Progressing well. DCytot at 0715 #Pain:  Per patient request #FWB: Cat 1 #GBS status:   negative #Feeding: Breastmilk  and Formula #Reproductive Life planning: Progesterone only pills #Circ:   yes   #BMI: no GDM, @[redacted]w[redacted]d , CWD, normal anatomy, cephalic presentation, posterior placental lie, 2641g, 30% EFW   Ebony Goldstein, MD  10/20/2023, 4:09 PM   Evaluation and management procedures were performed by the [facility] under my supervision. I was immediately available for direct supervision, assistance and direction throughout this encounter.  I also confirm that I have verified the information documented in the student's note, and that I have also personally reperformed the pertinent components of the physical exam and all of the medical decision making activities.  I have also made any necessary editorial changes.    Candice Chalet, MD Family Medicine - Obstetrics Fellow

## 2023-10-20 NOTE — Lactation Note (Signed)
 This note was copied from a baby's chart. Lactation Consultation Note  Patient Name: Theresa Brady [id number] Date: 10/20/2023 Age:30 hours Reason for consult: Initial assessment;Term Mom has attempted to latch baby but unable to. Baby rooting. LC attempted to latch baby in football hold. Baby will not suckle on breast tissue while LC t-cupping in his mouth. Unable to latch unless doing that. LC got NS applied that. At first baby fighting it, pushing away, crying, searching trying to latch. Keeping breast firm, finally got baby latch. He would suckle some then stop. Finally he started suckling at long intervals w/swallows. He was getting colostrum and then had a good feed. Calm at the breast during feeding. Noted thick colostrum in NS when finished feeding. Gave mom shells to wear in am. Mom shown how to use DEBP & how to disassemble, clean, & reassemble parts. Pump every 3 hrs.  Mom didn't want to pump tonight. Very tired. Mom encouraged to feed baby 8-12 times/24 hours and with feeding cues.  Newborn feeding habits, behavior, STS, I&O, body alignment, support reviewed. Encouraged mom to call for assistance as needed. Praised mom for good feeding. Encouraged to have patients w/baby while he is learning. Mom remained calm and pleasant during feeding. Maternal Data Has patient been taught Hand Expression?: Yes Does the patient have breastfeeding experience prior to this delivery?: Yes How long did the patient breastfeed?: pumped 3 months  Feeding    LATCH Score Latch: Grasps breast easily, tongue down, lips flanged, rhythmical sucking.  Audible Swallowing: A few with stimulation  Type of Nipple: Flat  Comfort (Breast/Nipple): Soft / non-tender  Hold (Positioning): Full assist, staff holds infant at breast  LATCH Score: 6   Lactation Tools Discussed/Used Tools: Shells;Pump;Flanges;Nipple Shields Nipple shield size: 20 Flange Size: 18 Breast pump type: Double-Electric Breast  Pump Pump Education: Setup, frequency, and cleaning;Milk Storage Reason for Pumping: flat/NS Pumping frequency: q 3hr  Interventions Interventions: Breast feeding basics reviewed;Assisted with latch;Skin to skin;Breast massage;Hand express;Pre-pump if needed;Breast compression;Adjust position;Support pillows;Position options;Shells;DEBP;Education;LC Services brochure  Discharge    Consult Status Consult Status: Follow-up Date: 10/21/23 Follow-up type: In-patient    Landyn Buckalew G 10/20/2023, 11:42 PM

## 2023-10-20 NOTE — Anesthesia Preprocedure Evaluation (Addendum)
 Anesthesia Evaluation  Patient identified by MRN, date of birth, ID band Patient awake    Reviewed: Allergy & Precautions, NPO status , Patient's Chart, lab work & pertinent test results  Airway Mallampati: III  TM Distance: >3 FB Neck ROM: Full    Dental no notable dental hx.    Pulmonary Current SmokerPatient did not abstain from smoking.   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)  Renal/GU negative Renal ROS  negative genitourinary   Musculoskeletal negative musculoskeletal ROS (+)    Abdominal   Peds  Hematology negative hematology ROS (+)   Anesthesia Other Findings IOL for BMI  Reproductive/Obstetrics (+) Pregnancy                             Anesthesia Physical Anesthesia Plan  ASA: 3  Anesthesia Plan: Epidural   Post-op Pain Management:    Induction:   PONV Risk Score and Plan: Treatment may vary due to age or medical condition  Airway Management Planned: Natural Airway  Additional Equipment:   Intra-op Plan:   Post-operative Plan:   Informed Consent: I have reviewed the patients History and Physical, chart, labs and discussed the procedure including the risks, benefits and alternatives for the proposed anesthesia with the patient or authorized representative who has indicated his/her understanding and acceptance.       Plan Discussed with: Anesthesiologist  Anesthesia Plan Comments: (Patient identified. Risks, benefits, options discussed with patient including but not limited to bleeding, infection, nerve damage, paralysis, failed block, incomplete pain control, headache, blood pressure changes, nausea,  vomiting, reactions to medication, itching, and post partum back pain. Confirmed with bedside nurse the patient's most recent platelet count. Confirmed with the patient that they are not taking any anticoagulation, have any bleeding history or any family history of bleeding disorders. Patient expressed understanding and wishes to proceed. All questions were answered. )       Anesthesia Quick Evaluation

## 2023-10-20 NOTE — Progress Notes (Signed)
 LABOR PROGRESS NOTE  Patient Name: Theresa Brady, female   DOB: [birth year], 28 y.o.  MRN: [id number]  Pt comfortable after epidural, nursing CVE 4/80/-2, toco with spaced contractions.  Start Pit 2x2.   Ebony Goldstein, MD

## 2023-10-20 NOTE — Discharge Summary (Signed)
     Postpartum Discharge Summary  Date of Service updated***     Patient Name: Theresa Brady DOB: [birth year] MRN: [id number]  Date of admission: 10/20/2023 Delivery date:10/20/2023 Delivering provider: CHUBB, CASEY C Date of discharge: 10/20/2023  Admitting diagnosis: BMI 50.0-59.9, adult (HCC) [Z68.43] Intrauterine pregnancy: [redacted]w[redacted]d     Secondary diagnosis:  Principal Problem:   BMI 50.0-59.9, adult (HCC)  Additional problems: ***    Discharge diagnosis: Term Pregnancy Delivered and BMI                                              Post partum procedures:{Postpartum procedures:23558} Augmentation: AROM, Pitocin , and Cytotec  Complications: None  Hospital course: Induction of Labor With Vaginal Delivery   28 y.o. yo G2P1001 at [redacted]w[redacted]d was admitted to the hospital 10/20/2023 for induction of labor.  Indication for induction: BMI.  Patient had an labor course that was uncomplicated. Membrane Rupture Time/Date: 11:45 AM,10/20/2023  Delivery Method:Vaginal, Spontaneous Operative Delivery:N/A Episiotomy: None Lacerations:    Details of delivery can be found in separate delivery note.  Patient had a postpartum course complicated by***. Patient is discharged home 10/20/23.  Newborn Data: Birth date:10/20/2023 Birth time:7:37 PM Gender:Female Living status:Living Apgars:8 ,9  Weight:   Magnesium Sulfate received: {Mag received:30440022} BMZ received: No Rhophylac:N/A MMR:N/A T-DaP: declined Flu: No RSV Vaccine received: No Transfusion:{Transfusion received:30440034}  Immunizations received: Immunization History  Administered Date(s) Administered   Tdap 10/15/2012    Physical exam  Vitals:   10/20/23 1705 10/20/23 1743 10/20/23 1817 10/20/23 1849  BP: (!) 96/47 (!) 97/52 (!) 104/49 125/76  Pulse: 72 77 82 96  Resp:  18 16   Temp:   98 F (36.7 C)   TempSrc:   Oral   Weight:      Height:       General: {Exam; general:21111117} Lochia: {Desc;  appropriate/inappropriate:30686::"appropriate"} Uterine Fundus: {Desc; firm/soft:30687} Incision: {Exam; incision:21111123} DVT Evaluation: {Exam; dvt:2111122} Labs: Lab Results  Component Value Date   WBC 9.7 10/20/2023   HGB 12.8 10/20/2023   HCT 37.9 10/20/2023   MCV 85.0 10/20/2023   PLT 190 10/20/2023       No data to display         Edinburgh Score:     No data to display         No data recorded  After visit meds:  Allergies as of 10/20/2023   No Known Allergies   Med Rec must be completed prior to using this [facility]***        Discharge home in stable condition Infant Feeding: {Baby feeding:23562} Infant Disposition:{CHL IP OB HOME WITH YQMVHQ:46962} Discharge instruction: per After Visit Summary and Postpartum booklet. Activity: Advance as tolerated. Pelvic rest for 6 weeks.  Diet: {OB XBMW:41324401} Future Appointments:No future appointments. Follow up Visit: Message to FT 5/14  Please schedule this patient for a In person postpartum visit in 4 weeks with the following provider: Any provider. Additional Postpartum F/U:n/a  High risk pregnancy complicated by: BMI Delivery mode:  Vaginal, Spontaneous Anticipated Birth Control:  POPs   10/20/2023 Ebony Goldstein, MD

## 2023-10-20 NOTE — Progress Notes (Deleted)
 Patient ID: Theresa Brady, female   DOB: 08-13-1995, 28 y.o.   MRN: [id number] OBSTETRIC ADMISSION HISTORY AND PHYSICAL  Theresa Brady is a 28 y.o. female G2P1001 with IUP at [redacted]w[redacted]d by LMP presenting for IOL due to high-risk pregnancy complicated by PG BMI 44. She reports +FMs, No LOF, no VB, no blurry vision, headaches or peripheral edema, and RUQ pain.  She plans on both breast and bottle feeding. She request POPS for birth control. She received her prenatal [facility] at [facility]   Dating: By LMP --->  Estimated Date of Delivery: 10/26/23  Sono:    @[redacted]w[redacted]d , CWD, normal anatomy, cephalic presentation, posterior placental lie, 2641g, 30% EFW   Prenatal History/Complications:  - High-risk pregnancy complicated by PG BMI 44  Past Medical History: Past Medical History:  Diagnosis Date   Medical history non-contributory    Pregnant     Past Surgical History: Past Surgical History:  Procedure Laterality Date   CHOLECYSTECTOMY N/A 2011    Obstetrical History: OB History     Gravida  2   Para  1   Term  1   Preterm      AB      Living  1      SAB      IAB      Ectopic      Multiple      Live Births  1           Social History Social History   Socioeconomic History   Marital status: Married    Spouse name: Not on file   Number of children: Not on file   Years of education: Not on file   Highest education level: Not on file  Occupational History   Not on file  Tobacco Use   Smoking status: Every Day    Current packs/day: 0.25    Types: Cigarettes   Smokeless tobacco: Never  Vaping Use   Vaping status: Former  Substance and Sexual Activity   Alcohol use: No   Drug use: No   Sexual activity: Yes    Birth control/protection: None  Other Topics Concern   Not on file  Social History Narrative   Not on file   Social Drivers of Health   Financial Resource Strain: Low Risk  (03/31/2023)   Overall Financial Resource Strain (CARDIA)    Difficulty  of Paying Living Expenses: Not hard at all  Food Insecurity: No Food Insecurity (10/20/2023)   Hunger Vital Sign    Worried About Running Out of Food in the Last Year: Never true    Ran Out of Food in the Last Year: Never true  Transportation Needs: No Transportation Needs (10/20/2023)   PRAPARE - Administrator, Civil Service (Medical): No    Lack of Transportation (Non-Medical): No  Physical Activity: Insufficiently Active (03/31/2023)   Exercise Vital Sign    Days of Exercise per Week: 2 days    Minutes of Exercise per Session: 20 min  Stress: No Stress Concern Present (03/31/2023)   [employer] of Occupational Health - Occupational Stress Questionnaire    Feeling of Stress : Only a little  Social Connections: Moderately Integrated (03/31/2023)   Social Connection and Isolation Panel [NHANES]    Frequency of Communication with Friends and Family: Three times a week    Frequency of Social Gatherings with Friends and Family: Twice a week    Attends Religious Services: 1 to 4 times per year  Active Member of Clubs or Organizations: No    Attends Banker Meetings: Never    Marital Status: Married    Family History: Family History  Problem Relation Age of Onset   Hypertension Father    Diverticulitis Father    Other Maternal Grandmother        sepsis   Stroke Paternal Grandmother     Allergies: No Known Allergies  Medications Prior to Admission  Medication Sig Dispense Refill Last Dose/Taking   Prenatal Vit-Fe Fumarate-FA (PRENATAL VITAMIN PO) Take by mouth.   10/19/2023   aspirin  EC 81 MG tablet Take 2 tablets (162 mg total) by mouth daily. (Patient not taking: Reported on 08/04/2023) 60 tablet 6    diphenhydrAMINE  HCl (BENADRYL  ALLERGY PO) Take 25 mg by mouth once. (Patient not taking: Reported on 10/06/2023)        Review of Systems   All systems reviewed and negative except as stated in HPI  Blood pressure 111/70, pulse 74, temperature  98 F (36.7 C), temperature source Oral, resp. rate 16, height 5\' 2"  (1.575 m), weight 129.6 kg. General appearance: alert, cooperative, and no distress Lungs: clear to auscultation bilaterally Heart: regular rate and rhythm Abdomen: soft, non-tender; bowel sounds normal Pelvic: normal female genitalia  Extremities: Homans sign is negative, no sign of DVT DTR's 2+ Presentation: cephalic Fetal monitoringBaseline: 120 bpm, Variability: Good {> 6 bpm), Accelerations: Reactive, and Decelerations: Absent Uterine activityNone Dilation: 4 Effacement (%): 80 Station: -2 Exam by:: Araceli Knight, rn   Prenatal labs: ABO, Rh: --/--/A POS (05/14 2956) Antibody: NEG (05/14 0651) Rubella: 1.32 (11/11 1515) RPR: NON REACTIVE (05/14 0625)  HBsAg: Negative (11/11 1515)  HIV: Non Reactive (02/26 0819)  GBS: Negative/-- (04/23 1100)    Lab Results  Component Value Date   GBS Negative 09/29/2023   GTT WNL Genetic screening  negative Anatomy US  normal  Immunization History  Administered Date(s) Administered   Tdap 10/15/2012    Prenatal Transfer Tool  Maternal Diabetes: No Genetic Screening: Normal Maternal Ultrasounds/Referrals: Normal Fetal Ultrasounds or other Referrals:  None Maternal Substance Abuse:  No Significant Maternal Medications:  None Significant Maternal Lab Results: Group B Strep negative Number of Prenatal Visits:greater than 3 verified prenatal visits Maternal Vaccinations: declined  Other Comments:  None   Results for orders placed or performed during the hospital encounter of 10/20/23 (from the past 24 hours)  CBC   Collection Time: 10/20/23  6:25 AM  Result Value Ref Range   WBC 9.7 4.0 - 10.5 K/uL   RBC 4.46 3.87 - 5.11 MIL/uL   Hemoglobin 12.8 12.0 - 15.0 g/dL   HCT 21.3 08.6 - 57.8 %   MCV 85.0 80.0 - 100.0 fL   MCH 28.7 26.0 - 34.0 pg   MCHC 33.8 30.0 - 36.0 g/dL   RDW 46.9 62.9 - 52.8 %   Platelets 190 150 - 400 K/uL   nRBC 0.0 0.0 - 0.2 %  RPR    Collection Time: 10/20/23  6:25 AM  Result Value Ref Range   RPR Ser Ql NON REACTIVE NON REACTIVE  Type and screen   Collection Time: 10/20/23  6:51 AM  Result Value Ref Range   ABO/RH(D) A POS    Antibody Screen NEG    Sample Expiration      10/23/2023,2359 Performed at [facility], [address], [place], [place] [address]     Patient Active Problem List   Diagnosis Date Noted   BMI 50.0-59.9, adult (  HCC) 10/20/2023   BMI 45.0-49.9, adult (HCC) 05/17/2023   Supervision of high risk pregnancy, antepartum 04/16/2023    Assessment/Plan:  Theresa Brady is a 28 y.o. G2P1001 at [redacted]w[redacted]d here for IOL due to high-risk pregnancy complicated by PG BMI 44  #Labor:Progressing well. DCytot at 0715 #Pain: Per patient request #FWB: Cat 1 #GBS status:  negative #Feeding: Breastmilk  and Formula #Reproductive Life planning: Progesterone only pills #Circ:  yes  #BMI: no GDM, @[redacted]w[redacted]d , CWD, normal anatomy, cephalic presentation, posterior placental lie, 2641g, 30% EFW  Ebony Goldstein, MD  10/20/2023, 4:09 PM  Evaluation and management procedures were performed by the [facility] under my supervision. I was immediately available for direct supervision, assistance and direction throughout this encounter.  I also confirm that I have verified the information documented in the student's note, and that I have also personally reperformed the pertinent components of the physical exam and all of the medical decision making activities.  I have also made any necessary editorial changes.   Candice Chalet, MD Family Medicine - Obstetrics Fellow

## 2023-10-21 LAB — CBC
HCT: 32.6 % — ABNORMAL LOW (ref 36.0–46.0)
Hemoglobin: 11.1 g/dL — ABNORMAL LOW (ref 12.0–15.0)
MCH: 28.6 pg (ref 26.0–34.0)
MCHC: 34 g/dL (ref 30.0–36.0)
MCV: 84 fL (ref 80.0–100.0)
Platelets: 169 10*3/uL (ref 150–400)
RBC: 3.88 MIL/uL (ref 3.87–5.11)
RDW: 13.3 % (ref 11.5–15.5)
WBC: 11.3 10*3/uL — ABNORMAL HIGH (ref 4.0–10.5)
nRBC: 0 % (ref 0.0–0.2)

## 2023-10-21 LAB — BIRTH TISSUE RECOVERY COLLECTION (PLACENTA DONATION)

## 2023-10-21 MED ORDER — LIDOCAINE 1% INJECTION FOR CIRCUMCISION: 0.8000 mL | INJECTION | Freq: Once | INTRAVENOUS | Status: DC

## 2023-10-21 MED ORDER — EPINEPHRINE TOPICAL FOR CIRCUMCISION 0.1 MG/ML
1.0000 [drp] | TOPICAL | Status: DC | PRN
Start: 2023-10-21 — End: 2023-10-21

## 2023-10-21 MED ORDER — SUCROSE 24% NICU/PEDS ORAL SOLUTION: 0.5000 mL | OROMUCOSAL | Status: DC | PRN

## 2023-10-21 MED ORDER — VASELINE PETROLATUM GAUZE EX PADS: 1.0000 | MEDICATED_PAD | Freq: Once | CUTANEOUS | Status: DC | PRN

## 2023-10-21 MED ORDER — WHITE PETROLATUM EX OINT: 1.0000 | TOPICAL_OINTMENT | CUTANEOUS | Status: DC | PRN

## 2023-10-21 NOTE — Anesthesia Postprocedure Evaluation (Signed)
 Anesthesia Post Note  Patient: Theresa Brady  Procedure(s) Performed: AN AD HOC LABOR EPIDURAL     Patient location during evaluation: Mother Baby Anesthesia Type: Epidural Level of consciousness: awake Pain management: satisfactory to patient Vital Signs Assessment: post-procedure vital signs reviewed and stable Respiratory status: spontaneous breathing Cardiovascular status: stable Anesthetic complications: no  No notable events documented.  Last Vitals:  Vitals:   10/21/23 0214 10/21/23 0622  BP: 105/72 121/76  Pulse: 86 87  Resp: 18 18  Temp: 36.4 C 36.6 C  SpO2: 96% 97%    Last Pain:  Vitals:   10/21/23 0622  TempSrc: Oral  PainSc: 6    Pain Goal:                   [employer]

## 2023-10-21 NOTE — Patient Instructions (Signed)
 If interested in an outpatient lactation consult in office or virtually please reach out to us  at [facility] (First Floor) [address] [place] Please call [phone] and press 4 for lactation.    Melodi Sprung, [facility]

## 2023-10-21 NOTE — Lactation Note (Signed)
 This note was copied from a baby's chart. Lactation Consultation Note  Patient Name: Theresa Brady [id number] Date: 10/21/2023 Age:30 hours Reason for consult: Follow-up assessment  P2, Mother exclusively pumped with her first child.  Baby finishing bath when LC entered room.  Mother hand expressed good flow.  Baby has been spoon feeding and baby has been spitty. Attempted latching in both cross cradle and football hold with #20NS but baby became spitty and did not open to feed. Discussed pumping to stimulate mother's supply and calling for help as needed today. Left baby skin to skin on mother's chest. Family entered room as LC left. Maternal Data Has patient been taught Hand Expression?: Yes  Feeding Mother's Current Feeding Choice: Breast Milk  Lactation Tools Discussed/Used Nipple shield size: 20 (24 NS given for later) Breast pump type: Double-Electric Breast Pump;Manual  Interventions Interventions: Breast feeding basics reviewed;Assisted with latch;Skin to skin;Hand express;Education  Discharge Pump: Personal;DEBP  Consult Status Consult Status: Follow-up Date: 10/22/23 Follow-up type: In-patient  Luellen Sages  RN, IBCLC 10/21/2023, 8:36 AM

## 2023-10-21 NOTE — Progress Notes (Deleted)
 Circumcision Counseling Progress Note  Patient desires circumcision for her female infant.  Circumcision procedure details discussed, risks and benefits of procedure were also discussed.  These include but are not limited to: Benefits of circumcision in men include reduction in the rates of urinary tract infection (UTI), penile cancer, some sexually transmitted infections, penile inflammatory and retractile disorders, as well as easier hygiene.  Risks include bleeding , infection, injury of glans which may lead to penile deformity or urinary tract issues, unsatisfactory cosmetic appearance and other potential complications related to the procedure.  It was emphasized that this is an elective procedure.  Patient wants to proceed with circumcision; written informed consent obtained.  Will do circumcision soon, routine circumcision and post circumcision care ordered for the infant.  Frutoso Jing, MD

## 2023-10-21 NOTE — Progress Notes (Signed)
 POSTPARTUM PROGRESS NOTE  Post Partum Day 1  Subjective:  Theresa Brady is a 28 y.o. [phone] s/p spontaneous vaginal delivery at [redacted]w[redacted]d after presenting for induction of labor due to high-risk pregnancy complicated by PG BMI of 44.  She reports she is doing well. No acute events overnight. She denies any problems with ambulating, voiding or po intake. Denies nausea or vomiting.  Pain is moderately controlled with moderate bilateral lower back ache. Lochia is normal. Patient was interested in and consented regarding circumcision of her newborn.  Objective: Blood pressure 121/76, pulse 87, temperature 97.8 F (36.6 C), temperature source Oral, resp. rate 18, height 5\' 2"  (1.575 m), weight 129.6 kg, SpO2 97%, unknown if currently breastfeeding.  Physical Exam:  General: alert, cooperative and no distress Chest: no respiratory distress Heart: regular rate, distal pulses intact Uterine Fundus: firm, appropriately tender DVT Evaluation: No calf swelling or tenderness Extremities: trace bilateral lower extremity edema Skin: warm, dry  Recent Labs    10/20/23 0625 10/21/23 0542  HGB 12.8 11.1*  HCT 37.9 32.6*    Assessment/Plan: Theresa Brady is a 28 y.o. [id number] s/p spontaneous vaginal delivery at [redacted]w[redacted]d after induction of labor due to high-risk pregnancy complicated by PG BMI of 44.   PPD#1 - Doing well Routine postpartum care  Patient presents with mild anemia following delivery Consider providing iron supplementation upon discharge Contraception: Progestin-only pills Feeding: Breast feeding Dispo: Plan for discharge tomorrow.   LOS: 1 day   Park Bolk, Medical Student 10/21/2023, 8:37 AM

## 2023-10-22 MED ORDER — SENNOSIDES-DOCUSATE SODIUM 8.6-50 MG PO TABS
2.0000 | ORAL_TABLET | ORAL | 0 refills | Status: AC
Start: 2023-10-22 — End: ?

## 2023-10-22 MED ORDER — IBUPROFEN 600 MG PO TABS: 600.0000 mg | ORAL_TABLET | Freq: Four times a day (QID) | ORAL | 0 refills | Status: AC

## 2023-11-03 ENCOUNTER — Telehealth (HOSPITAL_COMMUNITY): Payer: Self-pay | Admitting: *Deleted

## 2023-11-03 NOTE — Telephone Encounter (Signed)
 11/03/2023  Name: JALYNE BRODZINSKI MRN: [id number] DOB: Jun 19, 1995  Reason for Call:  Transition of Care Hospital Discharge Call  Contact Status: Patient Contact Status: Message  Language assistant needed:          Follow-Up Questions:    Dimple Francis Postnatal Depression Scale:  In the Past 7 Days:    PHQ2-9 Depression Scale:     Discharge Follow-up:    Post-discharge interventions: NA  Pearlie Bougie, RN 11/03/2023 15:00

## 2023-11-18 DIAGNOSIS — Z419 Encounter for procedure for purposes other than remedying health state, unspecified: Secondary | ICD-10-CM | POA: Diagnosis not present

## 2023-11-24 ENCOUNTER — Encounter: Payer: Self-pay | Admitting: Women's Health

## 2023-11-24 ENCOUNTER — Ambulatory Visit: Admitting: Women's Health

## 2023-11-24 DIAGNOSIS — Z30011 Encounter for initial prescription of contraceptive pills: Secondary | ICD-10-CM

## 2023-11-24 DIAGNOSIS — Z1332 Encounter for screening for maternal depression: Secondary | ICD-10-CM

## 2023-11-24 MED ORDER — SLYND 4 MG PO TABS
1.0000 | ORAL_TABLET | Freq: Every day | ORAL | 3 refills | Status: AC
Start: 2023-11-24 — End: ?

## 2023-11-24 NOTE — Patient Instructions (Signed)
 Tips To Increase Milk Supply Lots of water! Enough so that your urine is clear Plenty of calories, if you're not getting enough calories, your milk supply can decrease Breastfeed/pump often, every 2-3 hours x 20-33mins Fenugreek 3 pills 3 times a day, this may make your urine smell like maple syrup Mother's Milk Tea Lactation cookies, google for the recipe Real oatmeal Body Armor sports drinks Liquid Gold Greater Than hydration drink

## 2023-11-24 NOTE — Progress Notes (Signed)
 POSTPARTUM VISIT Patient name: Theresa Brady MRN [id number]  Date of birth: 04/17/1996 Chief Complaint:   Postpartum Care  History of Present Illness:   Theresa Brady is a 28 y.o. G22P2002 Caucasian female being seen today for a postpartum visit. She is 5 weeks postpartum following a spontaneous vaginal delivery at 39.1 gestational weeks. IOL: yes, for morbid obesity BMI >=40. Anesthesia: epidural.  Laceration: none.  Complications: none. Inpatient contraception: no.   Pregnancy complicated by PGBMI >40. Tobacco use: yes. Substance use disorder: no. Last pap smear: 04/19/23 and results were NILM w/ HRHPV negative. Next pap smear due: 2027 No LMP recorded (lmp unknown).  Postpartum course has been uncomplicated. Bleeding none. Bowel function is normal. Bladder function is normal. Urinary incontinence? no, fecal incontinence? no Patient is sexually active. Last sexual activity: did not discuss. Desired contraception: POPs. Patient does want a pregnancy in the future.  Desired family size is 3 children.   Upstream - 11/24/23 1032       Pregnancy Intention Screening   Does the patient want to become pregnant in the next year? No    Does the patient's partner want to become pregnant in the next year? No    Would the patient like to discuss contraceptive options today? Yes      Contraception Wrap Up   Current Method Oral Contraceptive    End Method Oral Contraceptive    Contraception Counseling Provided Yes         The pregnancy intention screening data noted above was reviewed. Potential methods of contraception were discussed. The patient elected to proceed with Oral Contraceptive.  Edinburgh Postpartum Depression Screening: negative  Edinburgh Postnatal Depression Scale - 11/24/23 1028       Edinburgh Postnatal Depression Scale:  In the Past 7 Days   I have been able to laugh and see the funny side of things. 0    I have looked forward with enjoyment to things. 0    I have  blamed myself unnecessarily when things went wrong. 0    I have been anxious or worried for no good reason. 0    I have felt scared or panicky for no good reason. 0    Things have been getting on top of me. 0    I have been so unhappy that I have had difficulty sleeping. 0    I have felt sad or miserable. 0    I have been so unhappy that I have been crying. 0    The thought of harming myself has occurred to me. 0    Edinburgh Postnatal Depression Scale Total 0             04/19/2023    1:41 PM 03/31/2023   10:17 AM  GAD 7 : Generalized Anxiety Score  Nervous, Anxious, on Edge 1 1  Control/stop worrying 1 0  Worry too much - different things 1 0  Trouble relaxing 0 0  Restless 0 0  Easily annoyed or irritable 1 0  Afraid - awful might happen 1 0  Total GAD 7 Score 5 1     Baby's course has been uncomplicated. Baby is feeding by breast and bottle: milk supply inadequate . Infant has a pediatrician/family doctor? Yes.  Childcare strategy if returning to work/school: family.  Pt has material needs met for her and baby: Yes.   Review of Systems:   Pertinent items are noted in HPI Denies Abnormal vaginal discharge w/ itching/odor/irritation, headaches, visual  changes, shortness of breath, chest pain, abdominal pain, severe nausea/vomiting, or problems with urination or bowel movements. Pertinent History Reviewed:  Reviewed past medical,surgical, obstetrical and family history.  Reviewed problem list, medications and allergies. OB History  Gravida Para Term Preterm AB Living  2 2 2   2   SAB IAB Ectopic Multiple Live Births     0 2    # Outcome Date GA Lbr Len/2nd Weight Sex Type Anes PTL Lv  2 Term 10/20/23 [redacted]w[redacted]d 05:47 / 00:50 8 lb 1.5 oz (3.67 kg) M Vag-Spont EPI  LIV  1 Term 10/14/12 [redacted]w[redacted]d 10:16 / 00:17 7 lb 1.6 oz (3.22 kg) M Vag-Spont EPI  LIV   Physical Assessment:   Vitals:   11/24/23 1027  BP: 94/65  Pulse: 72  Weight: 273 lb (123.8 kg)  Height: 5' 2 (1.575 m)   Body mass index is 49.93 kg/m.       Physical Examination:   General appearance: alert, well appearing, and in no distress  Mental status: alert, oriented to person, place, and time  Skin: warm & dry   Cardiovascular: normal heart rate noted   Respiratory: normal respiratory effort, no distress   Breasts: deferred, no complaints   Abdomen: soft, non-tender   Pelvic: examination not indicated. Thin prep pap obtained: No  Rectal: not examined  Extremities: Edema: none   Chaperone: N/A       No results found for this or any previous visit (from the past 24 hours).  Assessment & Plan:  1) Postpartum exam 2) 5 wks s/p spontaneous vaginal delivery after IOL for PGBMI >40 3) breast & bottle feeding>milk tips given 4) Depression screening 5) Contraception management: rx Slynd to MyScripts, condoms x 2wks  Essential components of care per ACOG recommendations:  1.  Mood and well being:  If positive depression screen, discussed and plan developed.  If using tobacco we discussed reduction/cessation and risk of relapse If current substance abuse, we discussed and referral to local resources was offered.   2. Infant care and feeding:  If breastfeeding, discussed returning to work, pumping, breastfeeding-associated pain, guidance regarding return to fertility while lactating if not using another method. If needed, patient was provided with a letter to be allowed to pump q 2-3hrs to support lactation in a private location with access to a refrigerator to store breastmilk.   Recommended that all caregivers be immunized for flu, pertussis and other preventable communicable diseases If pt does not have material needs met for her/baby, referred to local resources for help obtaining these.  3. Sexuality, contraception and birth spacing Provided guidance regarding sexuality, management of dyspareunia, and resumption of intercourse Discussed avoiding interpregnancy interval <48mths and recommended  birth spacing of 18 months  4. Sleep and fatigue Discussed coping options for fatigue and sleep disruption Encouraged family/partner/community support of 4 hrs of uninterrupted sleep to help with mood and fatigue  5. Physical recovery  If pt had a C/S, assessed incisional pain and providing guidance on normal vs prolonged recovery If pt had a laceration, perineal healing and pain reviewed.  If urinary or fecal incontinence, discussed management and referred to PT or uro/gyn if indicated  Patient is safe to resume physical activity. Discussed attainment of healthy weight.  6.  Chronic disease management Discussed pregnancy complications if any, and their implications for future childbearing and long-term maternal health. Review recommendations for prevention of recurrent pregnancy complications, such as 17 hydroxyprogesterone caproate to reduce risk for recurrent PTB not applicable, or  aspirin  to reduce risk of preeclampsia not applicable. Pt had GDM: no. If yes, 2hr GTT scheduled: not applicable. Reviewed medications and non-pregnant dosing including consideration of whether pt is breastfeeding using a reliable resource such as LactMed: yes Referred for f/u w/ PCP or subspecialist providers as indicated: not applicable  7. Health maintenance Mammogram at 30yo or earlier if indicated Pap smears as indicated  Meds:  Meds ordered this encounter  Medications   Drospirenone (SLYND) 4 MG TABS    Sig: Take 1 tablet (4 mg total) by mouth daily.    Dispense:  90 tablet    Refill:  3    Follow-up: Return in about 1 year (around 11/23/2024) for Physical.   No orders of the defined types were placed in this encounter.   Ferd Householder CNM, [facility] 11/24/2023 10:44 AM
